# Patient Record
Sex: Male | Born: 1985
Health system: Southern US, Community
[De-identification: ages and names within clinical notes are randomized; demographics above are authoritative.]

## PROBLEM LIST (undated history)

## (undated) DIAGNOSIS — F419 Anxiety disorder, unspecified: Secondary | ICD-10-CM

## (undated) DIAGNOSIS — T7840XA Allergy, unspecified, initial encounter: Secondary | ICD-10-CM

## (undated) DIAGNOSIS — F32A Depression, unspecified: Secondary | ICD-10-CM

## (undated) HISTORY — PX: HERNIA REPAIR: SHX51

## (undated) HISTORY — DX: Allergy, unspecified, initial encounter: T78.40XA

## (undated) HISTORY — PX: UMBILICAL HERNIA REPAIR: SHX196

## (undated) HISTORY — DX: Anxiety disorder, unspecified: F41.9

## (undated) HISTORY — PX: RESECTION DISTAL CLAVICAL: SHX5053

## (undated) HISTORY — DX: Depression, unspecified: F32.A

## (undated) HISTORY — PX: FEMUR FRACTURE SURGERY: SHX633

## (undated) HISTORY — PX: KNEE SURGERY: SHX244

---
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
## 2007-06-17 ENCOUNTER — Emergency Department (HOSPITAL_COMMUNITY): Admission: EM | Admit: 2007-06-17 | Discharge: 2007-06-18 | Payer: Self-pay | Admitting: Emergency Medicine

## 2007-06-21 ENCOUNTER — Emergency Department (HOSPITAL_COMMUNITY): Admission: EM | Admit: 2007-06-21 | Discharge: 2007-06-22 | Payer: Self-pay | Admitting: Emergency Medicine

## 2009-06-10 ENCOUNTER — Inpatient Hospital Stay (HOSPITAL_COMMUNITY): Admission: EM | Admit: 2009-06-10 | Discharge: 2009-06-13 | Payer: Self-pay | Admitting: Emergency Medicine

## 2010-07-26 LAB — DIFFERENTIAL
Basophils Absolute: 0 10*3/uL (ref 0.0–0.1)
Basophils Absolute: 0 10*3/uL (ref 0.0–0.1)
Basophils Relative: 0 % (ref 0–1)
Basophils Relative: 0 % (ref 0–1)
Eosinophils Absolute: 0 10*3/uL (ref 0.0–0.7)
Eosinophils Absolute: 0.1 10*3/uL (ref 0.0–0.7)
Eosinophils Relative: 0 % (ref 0–5)
Eosinophils Relative: 0 % (ref 0–5)
Lymphocytes Relative: 14 % (ref 12–46)
Lymphocytes Relative: 7 % — ABNORMAL LOW (ref 12–46)
Lymphs Abs: 0.8 10*3/uL (ref 0.7–4.0)
Lymphs Abs: 2.5 10*3/uL (ref 0.7–4.0)
Monocytes Absolute: 1 10*3/uL (ref 0.1–1.0)
Monocytes Absolute: 1.2 10*3/uL — ABNORMAL HIGH (ref 0.1–1.0)
Monocytes Relative: 7 % (ref 3–12)
Monocytes Relative: 9 % (ref 3–12)
Neutro Abs: 14.3 10*3/uL — ABNORMAL HIGH (ref 1.7–7.7)
Neutro Abs: 9.3 10*3/uL — ABNORMAL HIGH (ref 1.7–7.7)
Neutrophils Relative %: 79 % — ABNORMAL HIGH (ref 43–77)
Neutrophils Relative %: 83 % — ABNORMAL HIGH (ref 43–77)

## 2010-07-26 LAB — CBC
HCT: 27.9 % — ABNORMAL LOW (ref 39.0–52.0)
HCT: 30 % — ABNORMAL LOW (ref 39.0–52.0)
HCT: 32.1 % — ABNORMAL LOW (ref 39.0–52.0)
HCT: 37.8 % — ABNORMAL LOW (ref 39.0–52.0)
Hemoglobin: 10.3 g/dL — ABNORMAL LOW (ref 13.0–17.0)
Hemoglobin: 11 g/dL — ABNORMAL LOW (ref 13.0–17.0)
Hemoglobin: 13.1 g/dL (ref 13.0–17.0)
Hemoglobin: 9.6 g/dL — ABNORMAL LOW (ref 13.0–17.0)
MCHC: 34.2 g/dL (ref 30.0–36.0)
MCHC: 34.3 g/dL (ref 30.0–36.0)
MCHC: 34.3 g/dL (ref 30.0–36.0)
MCHC: 34.8 g/dL (ref 30.0–36.0)
MCV: 92.6 fL (ref 78.0–100.0)
MCV: 93.1 fL (ref 78.0–100.0)
MCV: 93.7 fL (ref 78.0–100.0)
MCV: 94.9 fL (ref 78.0–100.0)
Platelets: 151 10*3/uL (ref 150–400)
Platelets: 178 10*3/uL (ref 150–400)
Platelets: 193 10*3/uL (ref 150–400)
Platelets: 305 10*3/uL (ref 150–400)
RBC: 2.94 MIL/uL — ABNORMAL LOW (ref 4.22–5.81)
RBC: 3.22 MIL/uL — ABNORMAL LOW (ref 4.22–5.81)
RBC: 3.46 MIL/uL — ABNORMAL LOW (ref 4.22–5.81)
RBC: 4.03 MIL/uL — ABNORMAL LOW (ref 4.22–5.81)
RDW: 13 % (ref 11.5–15.5)
RDW: 13.1 % (ref 11.5–15.5)
RDW: 13.1 % (ref 11.5–15.5)
RDW: 13.3 % (ref 11.5–15.5)
WBC: 11.2 10*3/uL — ABNORMAL HIGH (ref 4.0–10.5)
WBC: 18.1 10*3/uL — ABNORMAL HIGH (ref 4.0–10.5)
WBC: 9.3 10*3/uL (ref 4.0–10.5)
WBC: 9.8 10*3/uL (ref 4.0–10.5)

## 2010-07-26 LAB — PROTIME-INR
INR: 1.15 (ref 0.00–1.49)
INR: 1.39 (ref 0.00–1.49)
INR: 1.49 (ref 0.00–1.49)
Prothrombin Time: 14.6 seconds (ref 11.6–15.2)
Prothrombin Time: 16.9 seconds — ABNORMAL HIGH (ref 11.6–15.2)
Prothrombin Time: 17.9 seconds — ABNORMAL HIGH (ref 11.6–15.2)

## 2010-07-26 LAB — COMPREHENSIVE METABOLIC PANEL
ALT: 48 U/L (ref 0–53)
ALT: 74 U/L — ABNORMAL HIGH (ref 0–53)
AST: 130 U/L — ABNORMAL HIGH (ref 0–37)
AST: 66 U/L — ABNORMAL HIGH (ref 0–37)
Albumin: 2.9 g/dL — ABNORMAL LOW (ref 3.5–5.2)
Albumin: 3.8 g/dL (ref 3.5–5.2)
Alkaline Phosphatase: 42 U/L (ref 39–117)
Alkaline Phosphatase: 49 U/L (ref 39–117)
BUN: 14 mg/dL (ref 6–23)
BUN: 6 mg/dL (ref 6–23)
CO2: 24 mEq/L (ref 19–32)
CO2: 29 mEq/L (ref 19–32)
Calcium: 8 mg/dL — ABNORMAL LOW (ref 8.4–10.5)
Calcium: 8.5 mg/dL (ref 8.4–10.5)
Chloride: 104 mEq/L (ref 96–112)
Chloride: 106 mEq/L (ref 96–112)
Creatinine, Ser: 0.95 mg/dL (ref 0.4–1.5)
Creatinine, Ser: 0.98 mg/dL (ref 0.4–1.5)
GFR calc Af Amer: >60 mL/min (ref >60)
GFR calc Af Amer: >60 mL/min (ref >60)
GFR calc non Af Amer: >60 mL/min (ref >60)
GFR calc non Af Amer: >60 mL/min (ref >60)
Glucose, Bld: 120 mg/dL — ABNORMAL HIGH (ref 70–99)
Glucose, Bld: 88 mg/dL (ref 70–99)
Potassium: 4 mEq/L (ref 3.5–5.1)
Potassium: 4 mEq/L (ref 3.5–5.1)
Sodium: 137 mEq/L (ref 135–145)
Sodium: 139 mEq/L (ref 135–145)
Total Bilirubin: 0.5 mg/dL (ref 0.3–1.2)
Total Bilirubin: 0.6 mg/dL (ref 0.3–1.2)
Total Protein: 5.1 g/dL — ABNORMAL LOW (ref 6.0–8.3)
Total Protein: 6.5 g/dL (ref 6.0–8.3)

## 2010-07-26 LAB — APTT
aPTT: 28 seconds (ref 24–37)
aPTT: 39 seconds — ABNORMAL HIGH (ref 24–37)

## 2010-07-26 LAB — ABO/RH: ABO/RH(D): B POS

## 2010-07-26 LAB — TYPE AND SCREEN
ABO/RH(D): B POS
Antibody Screen: NEGATIVE

## 2010-07-26 LAB — ETHANOL: Alcohol, Ethyl (B): 82 mg/dL — ABNORMAL HIGH (ref 0–10)

## 2010-09-21 NOTE — Op Note (Signed)
NAME:  Philip Todd, Philip Todd                          ACCOUNT NO.:  [id number]   MEDICAL RECORD NO.:  [id number]                   PATIENT TYPE:  AMB   LOCATION:  DSC                                  FACILITY:  [facility]   PHYSICIAN:  Katy Fitch. Naaman Plummer., M.D.          DATE OF BIRTH:  [birth year]   DATE OF PROCEDURE:  04/22/2003  DATE OF DISCHARGE:  04/22/2003                                 OPERATIVE REPORT   PREOPERATIVE DIAGNOSIS:  Closed displaced right small finger metacarpal neck  fracture, Salter II-III.   POSTOPERATIVE DIAGNOSIS:  Closed displaced right small finger metacarpal  neck fracture, Salter II-III.   OPERATION:  Closed reduction and percutaneous Kirschner wire fixation of  right small finger metacarpal neck fracture utilizing 0.045 inch Kirschner  wires x4.   SURGEON:  Katy Fitch. Sypher, M.D.   ASSISTANT:  None.   ANESTHESIA:  General by LMA.   ANESTHESIOLOGIST:  Burna Forts, M.D.   INDICATIONS FOR PROCEDURE:  The patient is a 25 year old referred by the  [place] for the evaluation and  management of a severely displaced right small finger metacarpal fracture.  He had struck his hand against a firm object, sustaining a 100% displaced  metacarpal neck fracture.  He had more than 70 degrees of angulation of his  metacarpal head, which had fallen in a palmar direction off of the shaft of  the metacarpal.  He was referred for clinical evaluation and management at  this time.   DESCRIPTION OF PROCEDURE:  The patient was brought to the operating room and  placed in the supine position upon the operating room table.  Following an  informed consent in the office, as well as at the [facility] with his mother in attendance, we recommended  proceeding with a closed reduction and percutaneous Kirschner wire fixation  of his fracture.  He was transferred to the operating room and placed in the  supine position  on the operating room table.  Following the induction of general anesthesia  by LMA, the right arm was prepped with Betadine soap and solution and  sterilely draped.  No tourniquet was applied.  When anesthesia was  satisfactory, the fracture was manipulated with traction, hyperextension of  the metacarpal head, and three-point molding of the neck.  A virtual  anatomic reduction was achieved.  Two 0.045 inch Kirschner wires were drilled through the metacarpal head into  the metaphysis of the adjacent ring finger metacarpal to control rotation  and to prevent subsidence.  An additional two 0.045 inch Kirschner wires  were placed through the proximal metaphysis deep to the head, particularly  on its radial aspect, to prevent loss of the reduction with respect to apex  dorsal angulation of the fracture due to comminution of the palmar cortex.  AP and lateral images documented a virtual anatomic reduction of the  fracture.  The clinical alignment of the small finger was restored with near  anatomic length and no radial or ulnar rotation of the distal fragment.  The pins were trimmed and dressed in the usual manner with Xeroflo directly  on the skin, protecting the pin sites.  A dorsal palmar plaster sandwich  splint was applied, maintaining the hand in the same position.  There were  no apparent complications.  Both preoperatively and postoperatively the Barocio family was informed that  this fracture will likely cause a loss of growth in the small finger  metacarpal distal physis; however, as he is 17, he is nearly at the  completion of the skeletal growth.  There should be no significant  deformity.                                               Katy Fitch Naaman Plummer., M.D.    RVS/MEDQ  D:  04/22/2003  T:  04/24/2003  Job:  [id number]   cc:   Anesthesia Department - [facility]

## 2016-10-23 ENCOUNTER — Encounter: Payer: Self-pay | Admitting: Family Medicine

## 2016-10-23 ENCOUNTER — Ambulatory Visit (INDEPENDENT_AMBULATORY_CARE_PROVIDER_SITE_OTHER): Payer: 59 | Admitting: Family Medicine

## 2016-10-23 VITALS — BP 126/82 | HR 98 | Ht 71.25 in | Wt 167.5 lb

## 2016-10-23 DIAGNOSIS — Z9151 Personal history of suicidal behavior: Secondary | ICD-10-CM

## 2016-10-23 DIAGNOSIS — Z8659 Personal history of other mental and behavioral disorders: Secondary | ICD-10-CM

## 2016-10-23 DIAGNOSIS — Z915 Personal history of self-harm: Secondary | ICD-10-CM

## 2016-10-23 DIAGNOSIS — Z719 Counseling, unspecified: Secondary | ICD-10-CM

## 2016-10-23 DIAGNOSIS — Z62819 Personal history of unspecified abuse in childhood: Secondary | ICD-10-CM

## 2016-10-23 DIAGNOSIS — T1490XA Injury, unspecified, initial encounter: Secondary | ICD-10-CM

## 2016-10-23 DIAGNOSIS — F17200 Nicotine dependence, unspecified, uncomplicated: Secondary | ICD-10-CM | POA: Insufficient documentation

## 2016-10-23 DIAGNOSIS — F1290 Cannabis use, unspecified, uncomplicated: Secondary | ICD-10-CM

## 2016-10-23 DIAGNOSIS — Z1389 Encounter for screening for other disorder: Secondary | ICD-10-CM

## 2016-10-23 DIAGNOSIS — Z0000 Encounter for general adult medical examination without abnormal findings: Secondary | ICD-10-CM

## 2016-10-23 NOTE — Progress Notes (Signed)
New patient office visit note:  Impression and Recommendations:    1. Encounter for wellness examination   2. Screening for multiple conditions   3. Health education/counseling   4. Nicotine dependence, uncomplicated, unspecified nicotine product type      No problem-specific Assessment & Plan notes found for this encounter.   The patient was counseled, risk factors were discussed, anticipatory guidance given.   New Prescriptions   No medications on file     Discontinued Medications   No medications on file      Orders Placed This Encounter  Procedures  . CBC with Differential/Platelet  . Comprehensive metabolic panel  . Hemoglobin A1c  . Hepatitis C antibody  . HIV antibody  . Lipid panel  . T4, free  . TSH  . VITAMIN D 25 Hydroxy (Vit-D Deficiency, Fractures)     Gross side effects, risk and benefits, and alternatives of medications discussed with patient.  Patient is aware that all medications have potential side effects and we are unable to predict every side effect or drug-drug interaction that may occur.  Expresses verbal understanding and consents to current therapy plan and treatment regimen.  Return for FBW near future and then f/up 38mo or sooner as needed.  Please see AVS handed out to patient at the end of our visit for further patient instructions/ counseling done pertaining to today's office visit.    Note: This document was prepared using Dragon voice recognition software and may include unintentional dictation errors.  ----------------------------------------------------------------------------------------------------------------------    Subjective:    Chief complaint:   Chief Complaint  Patient presents with  . Establish Care     HPI: Philip Todd is a pleasant 31 y.o. male who [facility] to review their medical history with me and establish care.   I asked the patient to  review their chronic problem list with me to ensure everything was updated and accurate.    All recent office visits with other providers, any medical records that patient brought in etc  - I reviewed today.     Also asked pt to get me medical records from [facility] providers/ specialists that they had seen within the past 3-5 years- if they are in private practice and/or do not work for a [facility], [facility], [facility], [facility] or [facility] owned practice.  Told them to call their specialists to clarify this if they are not sure.   Remember please see sticky note for full health history.   Problem  Screening for Multiple Conditions  Health Education/Counseling  Nicotine Dependence      Wt Readings from Last 3 Encounters:  10/23/16 167 lb 8 oz (76 kg)   BP Readings from Last 3 Encounters:  10/23/16 130/90   Pulse Readings from Last 3 Encounters:  10/23/16 (!) 112   BMI Readings from Last 3 Encounters:  10/23/16 23.20 kg/m    Patient Care Team    Relationship Specialty Notifications Start End  Thomasene Lotpalski, Solmon Bohr, DO PCP - General Family Medicine  10/23/16     Patient Active Problem List   Diagnosis Date Noted  . Screening for multiple conditions 10/23/2016  . Health education/counseling 10/23/2016  . Nicotine dependence 10/23/2016     History reviewed. No pertinent past medical history.   History reviewed. No pertinent past medical history.   History reviewed. No pertinent surgical history.   Family History  Problem Relation Age of Onset  . Diabetes  Mother      History  Drug Use No     History  Alcohol Use  . Yes    Comment: occ.      History  Smoking Status  . Never Smoker  Smokeless Tobacco  . Never Used     No outpatient encounter prescriptions on file as of 10/23/2016.   No facility-administered encounter medications on file as of 10/23/2016.     Allergies: Penicillins   ROS   Objective:   Blood pressure 130/90, pulse (!) 112, height 5'  11.25" (1.81 m), weight 167 lb 8 oz (76 kg). Body mass index is 23.2 kg/m. General: Well Developed, well nourished, and in no acute distress.  Neuro: Alert and oriented x3, extra-ocular muscles intact, sensation grossly intact.  HEENT:Cataract/AT, PERRLA, neck supple, No carotid bruits Skin: no gross rashes  Cardiac: Regular rate and rhythm Respiratory: Essentially clear to auscultation bilaterally. Not using accessory muscles, speaking in full sentences.  Abdominal: not grossly distended Musculoskeletal: Ambulates w/o diff, FROM * 4 ext.  Vasc: less 2 sec cap RF, warm and pink  Psych:  No HI/SI, judgement and insight good, Euthymic mood. Full Affect.    No results found for this or any previous visit (from the past 2160 hour(s)).

## 2016-10-23 NOTE — Patient Instructions (Addendum)
Please call your insurance company and find out if they have preferred counselors in the area.    Please return to clinic for full list of them.   If you have insomnia or difficulty sleeping, this information is for you:  - Avoid caffeinated beverages after lunch,  no alcoholic beverages,  no eating within 2-3 hours of lying down,  avoid exposure to blue light before bed,  avoid daytime naps, and  needs to maintain a regular sleep schedule- go to sleep and wake up around the same time every night.   - Resolve concerns or worries before entering bedroom:  Discussed relaxation techniques with patient and to keep a journal to write down fears\ worries.  I suggested seeing a counselor for CBT.   - Recommend patient meditate or do deep breathing exercises to help relax.   Incorporate the use of white noise machines or listen to "sleep meditation music", or recordings of guided meditations for sleep from YouTube which are free, such as  "guided meditation for detachment from over thinking"  by Ina Kick.      What is Chronic Stress Syndrome, Symptoms & Ways to Deal With it   What is Chronic Stress Syndrome?  Chronic Stress Syndrome is something which can now be called as a medical condition due to the amount of stress an individual is going through these days. Chronic Stress Syndrome causes the body and mind to shutdown and the person has no control over himself or herself. Due to the demands of modern day life and the hardship throughout day and night takes its toll over a period of time and the body and brain starts demanding rest and a break. This leads to certain symptoms where your performance level starts to dip at work, you become irritable both at work and at home, you may stop enjoying activities you previously liked, you may become depressed, you may get angry for even small things. Chronic Stress Syndrome can significantly impact your quality life. Thus it is important understand the  symptoms of Chronic Stress Syndrome and react accordingly in order to cope up with it.  It is important to note here that a balanced work-home equation should be drawn to cut down symptoms of Chronic Stress Syndrome. Minor stressors can be overcome by the body's inbuilt stress response but when there is unending stress for a long period of time then an external help is required to ease the stress.  Chronic Stress Syndrome can physically and psychologically drain you over a period of time. For such cases stress management is the best way to cope up with Chronic Stress Syndrome. If Chronic Stress Syndrome is not treated then it may result in many health hazards like anxiety, muscle pain, insomnia, and high blood pressure along with a compromised immune system leading to frequent infections and missed days from work.    What are the Symptoms of Chronic Stress Syndrome?   The symptoms of Chronic Stress Syndrome are variable and range from generalized symptoms to emotional symptoms along with behavioral and cognitive symptoms. Some of these symptoms have been delineated below:  Generalized Symptoms of Chronic Stress Syndrome are: Anxiety Depression Social isolation Headache Abdominal pain Lack of sleep Back pain Difficulty in concentrating Hypertension Hemorrhoids Varicose veins Panic attacks/ Panic disorder Cardiovascular diseases.   Some of the Emotional Symptoms of Chronic Stress Syndrome are: To become easily agitated, moody and frustrated Feeling overwhelmed which makes you feel like you are losing control. Having difficulty relaxing and  have a peaceful mind Having low self esteem Feeling lonely Feeling worthless Feeling depressed Avoiding social environment.   Some of the Physical Symptoms of Chronic Stress Syndrome are: Headaches Lethargy Alternating diarrhea and constipation Nausea Muscles aches and pains Insomnia Rapid heartbeat and chest pain Infections and  frequent colds Decreased libido Nervousness and shaking Tinnitus Sweaty palms Dry mouth Clenched jaw.  Some of the Cognitive Symptoms of Chronic Stress Syndrome are: Constant worrying Racing thoughts Disorganization and forgetfulness Inability to focus Poor judgment Abundance of negativity.  Some of the Behavioral Symptoms of Chronic Stress Syndrome are: Changes in appetite with less desire to eat Avoiding responsibilities Indulgence in alcohol or recreational drug use Increased nail biting and being fidgety Ways to Deal With Chronic Stress Syndrome    Chronic Stress Syndrome is not something which cannot be addressed. A bit of effort from your side in the form of lifestyle modifications, a little bit of exercise, a balanced work life equation can do wonders and help you get rid of Chronic Stress Syndrome.  Get Proper Sleep: It has been proved that Chronic Stress Syndrome causes loss of sleep where an individual may not even be able to sleep for days unending. This may result in the individual feeling lethargic and unable to focus at work the following morning. This may lead to decreased performance at work. Thus, it is important to have a good sleep-wake cycle. For this, try and not drink any caffeinated beverage about four hours prior to going to sleep, as caffeine pumps up the adrenaline and causes you to stay awake resulting ultimately in Chronic Stress Syndrome.  Avoid Alcohol and Drugs: Another way to get rid of Chronic Stress Syndrome is lifestyle modifications. Stay away from alcohol and other recreational drugs. Take Short Frequent Breaks at Work: Try to take frequent breaks from work and do not work continuously. Try and manage your work in such a way that you even meet your deadline and come home on time for a happy dinner with family. A good time spent with family and kids does wonders in not only dealing with Chronic Stress Syndrome but also preventing it.  Become  Physically Active: Another step towards getting rid of Chronic Stress Syndrome is physical activity. If you do not have time to spend at the gym then at least try and go for daily walks for about half an hour a day which not only keeps the stress away but also is good for your overall health. Physical activity leads to production of endorphins which will make you feel relaxed and feel good.  Healthy Diet Can Help You Deal With Chronic Stress Syndrome: Have a balanced and healthy diet is another step towards a stress free life and keeping Chronic Stress Syndrome at bay. If time is a constraint then you can try eating three small meals a day. Try and avoid fast foods and take foods which are healthy and rich in proteins, fiber, and carbohydrates to boost your energy system.  Music Can Soothe Your Mind: Light music is one of the best and most effective relaxation techniques that one can try to overcome stress. It has shown to calm down the mind and take you away from all the stressors that you may be having. These days it is also being used as a therapy in some institutes for overcoming stress. It is important here to discuss the importance of a good social support system for patients with Chronic Stress Syndrome, as a good social support  framework can do wonders in taking the stress away from the patient and overcoming Chronic Stress Syndrome.  Meditation Can Help You Deal With Chronic Stress Syndrome Effectively: Meditation and yoga has also shown to be quite effective in relaxing the mind and coping up with Chronic Stress Syndrome   In cases where these measures are not helpful, then it is time for you to consult with a skilled psychologist or a psychiatrist for potential therapies or medications to control the stress response.   The psychologist can help you with a variety of steps for coping up with Chronic Stress Syndrome. Relaxation techniques and behavioral therapy are some of the methods employed by  psychologists. In some cases, medications can also be given to help relax the patient.  Since Chronic Stress Syndrome is both emotionally and physically draining for the patient and it also adversely affects the family life of the patient hence it is important for the patient to recognize the condition and taking steps to cope up with it. Escaping measures like alcohol and drug use are of no help as they only aggravate the condition apart from their other health hazards. If this condition is ignored or left untreated it can lead to various medical conditions like anxiety and depression and various other medical conditions.  Last but not least, smile as often as you can as it is the best gift that you can give to someone. The best way to stay relaxed is to have a good smile, exercise daily, spend time with your family, meditation and if required consultation with a good psychologist so that you can live a stress free life and overcome the symptoms of Chronic Stress Syndrome.

## 2016-10-24 ENCOUNTER — Encounter: Payer: Self-pay | Admitting: Family Medicine

## 2016-11-15 ENCOUNTER — Other Ambulatory Visit (INDEPENDENT_AMBULATORY_CARE_PROVIDER_SITE_OTHER): Payer: 59

## 2016-11-15 DIAGNOSIS — Z1389 Encounter for screening for other disorder: Secondary | ICD-10-CM | POA: Diagnosis not present

## 2016-11-15 DIAGNOSIS — Z0000 Encounter for general adult medical examination without abnormal findings: Secondary | ICD-10-CM

## 2016-11-15 DIAGNOSIS — Z719 Counseling, unspecified: Secondary | ICD-10-CM

## 2016-11-16 LAB — HIV ANTIBODY (ROUTINE TESTING W REFLEX): HIV Screen 4th Generation wRfx: NONREACTIVE

## 2016-11-16 LAB — CBC WITH DIFFERENTIAL/PLATELET
Basophils Absolute: 0 10*3/uL (ref 0.0–0.2)
Basos: 0 %
EOS (ABSOLUTE): 0.1 10*3/uL (ref 0.0–0.4)
Eos: 1 %
Hematocrit: 45.7 % (ref 37.5–51.0)
Hemoglobin: 15.4 g/dL (ref 13.0–17.7)
Immature Grans (Abs): 0 10*3/uL (ref 0.0–0.1)
Immature Granulocytes: 0 %
Lymphocytes Absolute: 2.4 10*3/uL (ref 0.7–3.1)
Lymphs: 42 %
MCH: 30 pg (ref 26.6–33.0)
MCHC: 33.7 g/dL (ref 31.5–35.7)
MCV: 89 fL (ref 79–97)
Monocytes Absolute: 0.4 10*3/uL (ref 0.1–0.9)
Monocytes: 8 %
Neutrophils Absolute: 2.8 10*3/uL (ref 1.4–7.0)
Neutrophils: 49 %
Platelets: 284 10*3/uL (ref 150–379)
RBC: 5.13 x10E6/uL (ref 4.14–5.80)
RDW: 13.1 % (ref 12.3–15.4)
WBC: 5.7 10*3/uL (ref 3.4–10.8)

## 2016-11-16 LAB — COMPREHENSIVE METABOLIC PANEL
ALT: 15 IU/L (ref 0–44)
AST: 15 IU/L (ref 0–40)
Albumin/Globulin Ratio: 1.7 (ref 1.2–2.2)
Albumin: 4.7 g/dL (ref 3.5–5.5)
Alkaline Phosphatase: 55 IU/L (ref 39–117)
BUN/Creatinine Ratio: 23 — ABNORMAL HIGH (ref 9–20)
BUN: 23 mg/dL — ABNORMAL HIGH (ref 6–20)
Bilirubin Total: 0.6 mg/dL (ref 0.0–1.2)
CO2: 21 mmol/L (ref 20–29)
Calcium: 9.8 mg/dL (ref 8.7–10.2)
Chloride: 102 mmol/L (ref 96–106)
Creatinine, Ser: 1.02 mg/dL (ref 0.76–1.27)
GFR calc Af Amer: 113 mL/min/{1.73_m2} (ref >59)
GFR calc non Af Amer: 98 mL/min/{1.73_m2} (ref >59)
Globulin, Total: 2.8 g/dL (ref 1.5–4.5)
Glucose: 94 mg/dL (ref 65–99)
Potassium: 4.3 mmol/L (ref 3.5–5.2)
Sodium: 144 mmol/L (ref 134–144)
Total Protein: 7.5 g/dL (ref 6.0–8.5)

## 2016-11-16 LAB — T4, FREE: Free T4: 1.45 ng/dL (ref 0.82–1.77)

## 2016-11-16 LAB — HEMOGLOBIN A1C
Est. average glucose Bld gHb Est-mCnc: 105 mg/dL
Hgb A1c MFr Bld: 5.3 % (ref 4.8–5.6)

## 2016-11-16 LAB — LIPID PANEL
Chol/HDL Ratio: 2.9 ratio (ref 0.0–5.0)
Cholesterol, Total: 158 mg/dL (ref 100–199)
HDL: 54 mg/dL (ref >39)
LDL Calculated: 89 mg/dL (ref 0–99)
Triglycerides: 77 mg/dL (ref 0–149)
VLDL Cholesterol Cal: 15 mg/dL (ref 5–40)

## 2016-11-16 LAB — VITAMIN D 25 HYDROXY (VIT D DEFICIENCY, FRACTURES): Vit D, 25-Hydroxy: 32.1 ng/mL (ref 30.0–100.0)

## 2016-11-16 LAB — HEPATITIS C ANTIBODY: Hep C Virus Ab: <0.1 s/co ratio (ref 0.0–0.9)

## 2016-11-16 LAB — TSH: TSH: 1.93 u[IU]/mL (ref 0.450–4.500)

## 2018-01-01 ENCOUNTER — Other Ambulatory Visit (INDEPENDENT_AMBULATORY_CARE_PROVIDER_SITE_OTHER): Payer: BLUE CROSS/BLUE SHIELD

## 2018-01-01 ENCOUNTER — Other Ambulatory Visit: Payer: Self-pay | Admitting: Family Medicine

## 2018-01-01 ENCOUNTER — Ambulatory Visit (INDEPENDENT_AMBULATORY_CARE_PROVIDER_SITE_OTHER): Payer: BLUE CROSS/BLUE SHIELD | Admitting: Family Medicine

## 2018-01-01 ENCOUNTER — Encounter: Payer: Self-pay | Admitting: Family Medicine

## 2018-01-01 VITALS — BP 135/91 | HR 106 | Ht 71.0 in | Wt 155.8 lb

## 2018-01-01 DIAGNOSIS — Z719 Counseling, unspecified: Secondary | ICD-10-CM | POA: Diagnosis not present

## 2018-01-01 DIAGNOSIS — F17200 Nicotine dependence, unspecified, uncomplicated: Secondary | ICD-10-CM | POA: Diagnosis not present

## 2018-01-01 DIAGNOSIS — Z0001 Encounter for general adult medical examination with abnormal findings: Secondary | ICD-10-CM | POA: Diagnosis not present

## 2018-01-01 DIAGNOSIS — K4090 Unilateral inguinal hernia, without obstruction or gangrene, not specified as recurrent: Secondary | ICD-10-CM | POA: Insufficient documentation

## 2018-01-01 DIAGNOSIS — N6320 Unspecified lump in the left breast, unspecified quadrant: Secondary | ICD-10-CM

## 2018-01-01 DIAGNOSIS — Z1389 Encounter for screening for other disorder: Secondary | ICD-10-CM

## 2018-01-01 DIAGNOSIS — Z0000 Encounter for general adult medical examination without abnormal findings: Secondary | ICD-10-CM

## 2018-01-01 NOTE — Progress Notes (Signed)
Male physical  Impression and Recommendations:    1. Encounter for general adult medical examination with abnormal findings   2. Health education/counseling   3. Indirect left inguinal hernia   4. Nicotine dependence, uncomplicated, unspecified nicotine product type   5. Breast mass, left     Inguinal Hernia - Avoid heavy lifting.  Red flag symptoms discussed with patient. - Advised patient no intervention needed right now, but if develops pain, nausea, or other concerning sx, to return to clinic for re-check.  Breast Mass, Left - Referral placed for ultrasound evaluation of left pectoral/breast area, and surgical consult after if applicable.   1) Anticipatory Guidance: Discussed importance of wearing a seatbelt while driving, not texting while driving;   sunscreen when outside along with skin surveillance; eating a balanced and modest diet; physical activity at least 25 minutes per day or 150 min/ week moderate to intense activity.   2) Immunizations / Screenings / Labs:  All immunizations are up-to-date per recommendations or will be updated today. Patient is due for dental and vision screens which pt will schedule independently. Will obtain CBC, CMP, HgA1c, Lipid panel, TSH and vit D when fasting, if not already done recently.    3) Weight:   BMI meaning discussed with patient.  Discussed goal of losing 5-10% of current body weight which would improve overall feelings of well being and improve objective health data. Improve nutrient density of diet through increasing intake of fruits and vegetables and decreasing saturated fats, white flour products and refined sugars.    4) Smoking Cessation - Encouraged patient to begin by weaning down on e-cigarettes from 5, to 3, to 0.  - Told pt to think seriously about quitting smoking!  Told pt it is very important for his/her health and well being.    - Smoking cessation instruction/counseling given:  counseled patient on the  dangers of tobacco use, advised patient to stop smoking, and reviewed strategies to maximize success  - Discussed with patient that there are multiple treatments to aid in quitting smoking, however I explained none will work unless pt really want to quit  - Told to call 1-800-QUIT-NOW 938-820-0072(1-585-280-8219-669) for free smoking cessation counseling and support, or pt can go online to www.heart.org - the American Heart Association website and search "quit smoking ".    5) BMI Counseling Explained to patient what BMI refers to, and what it means medically.    Told patient to think about it as a "medical risk stratification measurement" and how increasing BMI is associated with increasing risk/ or worsening state of various diseases such as hypertension, hyperlipidemia, diabetes, premature OA, depression etc.  American Heart Association guidelines for healthy diet, basically Mediterranean diet, and exercise guidelines of 30 minutes 5 days per week or more discussed in detail.  Health counseling performed.  All questions answered.    6) Lifestyle & Preventative Health Maintenance - Advised patient to continue working toward exercising to improve overall mental, physical, and emotional health.    - Encouraged patient to engage in daily physical activity, especially a formal exercise routine.  Recommended that the patient eventually strive for at least 150 minutes of moderate cardiovascular activity per week according to guidelines established by the [facility].   - Healthy dietary habits encouraged, including low-carb, and high amounts of lean protein in diet.   - Patient should also consume adequate amounts of water - half of body weight in oz of water per day.   7) Follow-Up -  Return in 6 months for chronic follow-up, or sooner if needed.  - Patient knows that he can return PRN for acute concerns.   Orders Placed This Encounter  Procedures  . US BREAST LTD UNI LEFT INC AXILLA    [employer]//CO SIGN REQ  01/01/18 PF NONE//LEFT BREAST MASS//NO BREAST SX'S/NO NEEDS/SB W/PT     Standing Status:   Future    Number of Occurrences:   1    Standing Expiration Date:   01/02/2019    Order Specific Question:   Reason for Exam (SYMPTOM  OR DIAGNOSIS REQUIRED)    Answer:   mass    Order Specific Question:   Preferred imaging location?    Answer:   [facility]  . Ambulatory referral to General Surgery    Referral Priority:   Routine    Referral Type:   Surgical    Referral Reason:   Specialty Services Required    Requested Specialty:   General Surgery    Number of Visits Requested:   1    Gross side effects, risk and benefits, and alternatives of medications discussed with patient.  Patient is aware that all medications have potential side effects and we are unable to predict every side effect or drug-drug interaction that may occur.  Expresses verbal understanding and consents to current therapy plan and treatment regimen.  Please see AVS handed out to patient at the end of our visit for further patient instructions/ counseling done pertaining to today's office visit.  Follow-up preventative CPE in 1 year. Follow-up office visit pending lab work.  F/up sooner for chronic care management and/or prn  This document serves as a record of services personally performed by Thomasene Lot, DO. It was created on her behalf by Peggye Fothergill, a trained medical scribe. The creation of this record is based on the scribe's personal observations and the provider's statements to them.   I have reviewed the above medical documentation for accuracy and completeness and I concur.  Thomasene Lot 01/02/18 2:58 PM     Subjective:    CC: CPE  HPI: Philip Todd is a 32 y.o. male who presents to [facility] today for a yearly health maintenance exam.    Health Maintenance Summary Reviewed and updated, unless pt declines services.  Colonoscopy:    Unnecessary secondary to  < 18 years old. Patient is 32. Tobacco History Reviewed:  Current social smoker; using Juul e-smoking device. Patient desires to quit smoking. Abdominal Ultrasound:  Unnecessary secondary to < 74 years old. Alcohol:    Socially.  No concerns, no excessive use. Exercise Habits:   Patient exercises every day; states "puts in some miles," about 3-4 miles per day. STD concerns:   None; monogamous with wife Drug Use:  Continues smoking recreational marijuana. Birth control method:   No concerns Testicular/penile concerns:   None reported.  Denies family history of colon cancer.  Grandfather had colon or prostate cancer in late 50's, maybe 23's.  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  Recently got a new job and is working with his wife now.  Last eye exam was more than a year ago, less than two years ago.  Notes lately he's been having some issues hearing, and some issues seeing in the dark.  Patient reports two hernias, one in his bellybutton, and one in his pubic area.  States he is currently recovering from poison ivy.  Patient reports that he  has had a mass in his left pectoral/breast area for over a year.   Immunization History  Administered Date(s) Administered  . Td 06/19/2017    Health Maintenance  Topic Date Due  . INFLUENZA VACCINE  12/04/2017  . TETANUS/TDAP  06/20/2027  . HIV Screening  Completed      Wt Readings from Last 3 Encounters:  01/01/18 155 lb 12.8 oz (70.7 kg)  10/23/16 167 lb 8 oz (76 kg)   BP Readings from Last 3 Encounters:  01/01/18 (!) 135/91  10/23/16 126/82   Pulse Readings from Last 3 Encounters:  01/01/18 (!) 106  10/23/16 98    Patient Active Problem List   Diagnosis Date Noted  . Nicotine dependence 10/23/2016    Priority: High  . Trauma in childhood 10/23/2016    Priority: High  . Marijuana use, continuous 10/23/2016    Priority: Medium  . Indirect left inguinal hernia 01/01/2018  .  Breast mass, left 01/01/2018  . Screening for multiple conditions 10/23/2016  . Health education/counseling 10/23/2016  . History of posttraumatic stress disorder (PTSD)- as teen 10/23/2016  . H/O: attempted suicide as teen 10/23/2016    History reviewed. No pertinent past medical history.  History reviewed. No pertinent surgical history.  Family History  Problem Relation Age of Onset  . Diabetes Mother     Social History   Substance and Sexual Activity  Drug Use No  ,  Social History   Substance and Sexual Activity  Alcohol Use Yes   Comment: occ.   ,  Social History   Tobacco Use  Smoking Status Former Smoker  . Packs/day: 1.00  . Types: Cigarettes  . Last attempt to quit: 09/2017  . Years since quitting: 0.4  Smokeless Tobacco Never Used  ,  Social History   Substance and Sexual Activity  Sexual Activity Yes  . Partners: Female    Patient's Medications   No medications on file    Penicillins  Review of Systems: General:   Denies fever, chills, unexplained weight loss.  Optho/Auditory:   Denies visual changes, blurred vision/LOV Respiratory:   Denies SOB, DOE more than baseline levels.  Cardiovascular:   Denies chest pain, palpitations, new onset peripheral edema  Gastrointestinal:   Denies nausea, vomiting, diarrhea.  Genitourinary: Denies dysuria, freq/ urgency, flank pain or discharge from genitals.  Endocrine:     Denies hot or cold intolerance, polyuria, polydipsia. Musculoskeletal:   Denies unexplained myalgias, joint swelling, unexplained arthralgias, gait problems.  Skin:  Denies rash, suspicious lesions Neurological:     Denies dizziness, unexplained weakness, numbness  Psychiatric/Behavioral:   Denies mood changes, suicidal or homicidal ideations, hallucinations    Objective:     Blood pressure (!) 135/91, pulse (!) 106, height 5\' 11"  (1.803 m), weight 155 lb 12.8 oz (70.7 kg), SpO2 98 %. Body mass index is 21.73 kg/m. General  Appearance:    Alert, cooperative, no distress, appears stated age  Head:    Normocephalic, without obvious abnormality, atraumatic  Eyes:    PERRL, conjunctiva/corneas clear, EOM's intact, fundi    benign, both eyes  Ears:    Normal TM's and external ear canals, both ears  Nose:   Nares normal, septum midline, mucosa normal, no drainage    or sinus tenderness  Throat:   Lips w/o lesion, mucosa moist, and tongue normal; teeth and   gums normal  Neck:   Supple, symmetrical, trachea midline, no adenopathy;    thyroid:  no enlargement/tenderness/nodules; no carotid  bruit or JVD  Back:     Symmetric, no curvature, ROM normal, no CVA tenderness  Lungs:     Clear to auscultation bilaterally, respirations unlabored, no       Wh/ R/ R  Chest Wall:    At 3 o'clock position in the left breast about one inch lateral to the nipple is a 3 cm soft palpable mass; no other tenderness or gross deformity; normal excursion   Heart:    Regular rate and rhythm, S1 and S2 normal, no murmur, rub   or gallop  Abdomen:     Soft, non-tender, bowel sounds active all four quadrants, NO   G/R/R, no masses, no organomegaly  Genitalia:   Ext genitalia: without lesion, no penile rash or discharge, left inguinal hernia appreciated, most prominent internal ring, consistent with indirect hernia.  Rectal:    Deferred until age 71.  Extremities:   Extremities normal, atraumatic, no cyanosis or gross edema  Pulses:   2+ and symmetric all extremities  Skin:   Warm, dry, Skin color, texture, turgor normal, no obvious rashes or lesions  M-Sk:   Ambulates * 4 w/o difficulty, no gross deformities, tone WNL  Neurologic:   CNII-XII intact, normal strength, sensation and reflexes    Throughout Psych:  No HI/SI, judgement and insight good, Euthymic mood. Full Affect.

## 2018-01-01 NOTE — Patient Instructions (Addendum)
Melissa, please place order for unilateral L breast Ultrasound    Please think seriously about quitting smoking!  This is very important for your health and well being.   Smoking cessation instruction/counseling given:  counseled patient on the dangers of tobacco use, advised patient to stop smoking, and reviewed strategies to maximize success  Discussed with patient that there are multiple treatments to aid in quitting smoking, however I explained none will work unless pt really wants to quit  Please let us know in the future if you are interested and ready to quit.  You can also call 1-800-QUIT-NOW 6146994264) for free smoking cessation counseling and support.     Also, please go online to www.heart.org (the American Heart Association website) and search "quit smoking ".     Or try the centers for disease control website at: https://www.schmidt.com/  Or, go to the "national cancer institute" web site of NIH:  http://benson.com/  There is a lot of great information on these websites for you to look over.      Want to Quit Smoking? FDA-Approved Products Can Help  Quitting smoking can be hard, but it is possible. In fact, every time you put out a cigarette is a new chance to try quitting again, according to the U.S. Food and Drug Administration's newest tobacco education campaign, "Every Try Counts."   If you want to quit-almost 70 percent of adult smokers say they do-you may want to use a "smoking cessation" product proven to help. Data has shown that using FDA-approved cessation medicine can double your chance of quitting successfully.  Some products contain nicotine as an active ingredient and others do not. These products include over-the-counter (OTC) options like skin patches, lozenges, and gum, as well as prescription medicines.  Smoking cessation products are intended to help you quit  smoking. They are regulated through the [facility], which ensures that the products are safe and effective and that their benefits outweigh any known associated risks.  The Benefits of Quitting Smoking No matter how much you smoke-or for how long-quitting will benefit you.  Not only will you lower your risk of getting various cancers, including lung cancer, you'll also reduce your chances of having heart disease, a stroke, emphysema, and other serious diseases. Quitting also will lower the risk of heart disease and lung cancer in nonsmokers who otherwise would be exposed to your secondhand smoke.  Although there are benefits to quitting at any age, it is important to quit as soon as possible so your body can begin to heal from the damage caused by smoking. For instance, 12 hours after you quit smoking the carbon monoxide level in your blood drops to normal. Carbon monoxide is harmful because it displaces oxygen in the blood and deprives your heart, brain, and other vital organs of oxygen.  What To Know About Smoking Cessation Products Understanding how smoking cessation products work-and what side effects they may cause-can help you determine which product may be best for you.  If you're considering one of these products, reading labels and talking to your pharmacist and other health care providers are good first steps to take.  You also can check the FDA's website for more information on each product at Drugs_0 , where you can search for each product by name.  And remember to weigh each product's benefits and risks, among other considerations.  About Nicotine Replacement Therapy (NRT) Nicotine is the substance primarily responsible for causing addiction to tobacco products. Tobacco  users who are addicted to nicotine are used to having nicotine in their bodies.  As you try to quit smoking, you may have symptoms of nicotine withdrawal. When you quit, this  withdrawal may cause symptoms like cravings, or urges, to smoke; depression; trouble sleeping; irritability; anxiety; and increased appetite.  Nicotine withdrawal can discourage some smokers from continuing with a quit attempt. But the FDA has approved several smoking cessation products designed to help users gradually withdraw from smoking (that is, "wean" themselves from smoking) by using specific amounts of nicotine that decrease over time. This type of product is called a "nicotine replacement therapy" or NRT. It supplies nicotine in controlled amounts while sparing you from other chemicals found in tobacco products.  NRTs are available over the counter and by prescription. You should generally use them only for a short time to help you manage nicotine cravings and withdrawal. However, the FDA recognizes that some people may need to use these products longer to stay smoke-free. Talk to your health care provider to determine the best course of treatment for you.  Over-the-counter NRTs are approved for sale to people age 25 and older. They are available under various brand names and sometimes as generic products. They include:  - Skin patches (also called "transdermal nicotine patches"). These patches are placed on the skin, similar to how you would apply an adhesive bandage. - Chewing gum (also called "nicotine gum"). This gum must be chewed according to the labeled instructions to be effective. - Lozenges (also called "nicotine lozenges"). You use these products by dissolving them in your mouth. For over-the-counter products, it's important to follow the instructions on the Drug Facts Label (DFL) and to read the enclosed User's Guide for complete directions and other important information. Ask your health care provider if you have questions.  Currently, prescription nicotine replacement therapy is available only under the brand name Nicotrol, and is available both as a nasal spray and an oral  inhaler. The products are FDA-approved only for use by adults.  If you are under age 63 and want to quit smoking, talk to a health care professional about whether you should use nicotine replacement therapies.  Important Advice for People Considering Nicotine Replacement Therapy Women who are pregnant or breastfeeding should talk to their health care providers and use nicotine replacement products only if the health care providers approve.  Also talk to your health care provider before using these products if you have:  diabetes, heart disease, asthma, or stomach ulcers; had a recent heart attack; high blood pressure that is not controlled with medicine; a history of irregular heartbeat; or been prescribed medication to help you quit smoking. If you take prescription medication for depression or asthma, tell your health care provider if you are quitting smoking because he or she may need to change your prescription dose.  Stop using a nicotine replacement product and call your health care professional if you have any of the following symptoms: nausea; dizziness; weakness; vomiting; fast or irregular heartbeat; mouth problems with the lozenge or gum; or redness or swelling of the skin around the patch that does not go away.  About Prescription Cessation Medicines Without Nicotine  The FDA has approved two smoking cessation products that do not contain nicotine. They are Chantix (varenicline tartrate) and Zyban (buproprion hydrochloride). Both are available in tablet form and by prescription only.  Chantix acts at sites in the brain affected by nicotine by reducing the rewarding effects of nicotine. The precise way  that Zyban helps with smoking cessation is unknown.  As with other prescription products, the FDA has evaluated these medicines and found that the benefits outweigh the risks. For users taking these products, risks include changes in behavior, depressed mood, hostility,  aggression, and suicidal thoughts or actions.  The most common side effects of Chantix include nausea; constipation; gas; vomiting; and trouble sleeping or vivid, unusual, or strange dreams. Chantix also may change how you react to alcohol, so talk to your health care provider about your drinking habits (if you drink alcohol) and whether these habits need to change. Chantix is not recommended for people under the age of 74.  The most commonly observed side effects consistently associated with the use of Zyban are dry mouth and insomnia.  Because Zyban contains the same active ingredient as the antidepressant Wellbutrin (bupropion), the FDA encourages people who use Zyban-and those who are considering it-to talk to their health care providers about the risks of treatment with antidepressant medicines. Zyban has not been studied in children under the age of 68 and is not approved for use in children and teenagers.  Note: If your health care provider prescribes Chantix or Zyban, please read the product's patient medication guide in its entirety. These guides offer important information on side effects, risks, warnings, product ingredients, and what you should talk about with your health care provider before taking the products.  Finally, if you ever have any side effects related to any smoking cessation products, or have any other problems related to your treatment, the FDA would like to hear from you. Please consider making a voluntary and confidential report to the FDA's MedWatch program.  Updated: April 15, 2016    Preventive Care for Adults, Male A healthy lifestyle and preventive care can promote health and wellness. Preventive health guidelines for men include the following key practices:  A routine yearly physical is a good way to check with your health care provider about your health and preventative screening. It is a chance to share any concerns and updates on your health and to receive  a thorough exam.  Visit your dentist for a routine exam and preventative care every 6 months. Brush your teeth twice a day and floss once a day. Good oral hygiene prevents tooth decay and gum disease.  The frequency of eye exams is based on your age, health, family medical history, use of contact lenses, and other factors. Follow your health care provider's recommendations for frequency of eye exams.  Eat a healthy diet. Foods such as vegetables, fruits, whole grains, low-fat dairy products, and lean protein foods contain the nutrients you need without too many calories. Decrease your intake of foods high in solid fats, added sugars, and salt. Eat the right amount of calories for you.Get information about a proper diet from your health care provider, if necessary.  Regular physical exercise is one of the most important things you can do for your health. Most adults should get at least 150 minutes of moderate-intensity exercise (any activity that increases your heart rate and causes you to sweat) each week. In addition, most adults need muscle-strengthening exercises on 2 or more days a week.  Maintain a healthy weight. The body mass index (BMI) is a screening tool to identify possible weight problems. It provides an estimate of body fat based on height and weight. Your health care provider can find your BMI and can help you achieve or maintain a healthy weight.For adults 20 years and older:  A BMI below 18.5 is considered underweight.  A BMI of 18.5 to 24.9 is normal.  A BMI of 25 to 29.9 is considered overweight.  A BMI of 30 and above is considered obese.  Maintain normal blood lipids and cholesterol levels by exercising and minimizing your intake of saturated fat. Eat a balanced diet with plenty of fruit and vegetables. Blood tests for lipids and cholesterol should begin at age 64 and be repeated every 5 years. If your lipid or cholesterol levels are high, you are over 50, or you are at high  risk for heart disease, you may need your cholesterol levels checked more frequently.Ongoing high lipid and cholesterol levels should be treated with medicines if diet and exercise are not working.  If you smoke, find out from your health care provider how to quit. If you do not use tobacco, do not start.  Lung cancer screening is recommended for adults aged 21-80 years who are at high risk for developing lung cancer because of a history of smoking. A yearly low-dose CT scan of the lungs is recommended for people who have at least a 30-pack-year history of smoking and are a current smoker or have quit within the past 15 years. A pack year of smoking is smoking an average of 1 pack of cigarettes a day for 1 year (for example: 1 pack a day for 30 years or 2 packs a day for 15 years). Yearly screening should continue until the smoker has stopped smoking for at least 15 years. Yearly screening should be stopped for people who develop a health problem that would prevent them from having lung cancer treatment.  If you choose to drink alcohol, do not have more than 2 drinks per day. One drink is considered to be 12 ounces (355 mL) of beer, 5 ounces (148 mL) of wine, or 1.5 ounces (44 mL) of liquor.  Avoid use of street drugs. Do not share needles with anyone. Ask for help if you need support or instructions about stopping the use of drugs.  High blood pressure causes heart disease and increases the risk of stroke. Your blood pressure should be checked at least every 1-2 years. Ongoing high blood pressure should be treated with medicines, if weight loss and exercise are not effective.  If you are 65-61 years old, ask your health care provider if you should take aspirin to prevent heart disease.  Diabetes screening is done by taking a blood sample to check your blood glucose level after you have not eaten for a certain period of time (fasting). If you are not overweight and you do not have risk factors for  diabetes, you should be screened once every 3 years starting at age 11. If you are overweight or obese and you are 2-66 years of age, you should be screened for diabetes every year as part of your cardiovascular risk assessment.  Colorectal cancer can be detected and often prevented. Most routine colorectal cancer screening begins at the age of 45 and continues through age 93. However, your health care provider may recommend screening at an earlier age if you have risk factors for colon cancer. On a yearly basis, your health care provider may provide home test kits to check for hidden blood in the stool. Use of a small camera at the end of a tube to directly examine the colon (sigmoidoscopy or colonoscopy) can detect the earliest forms of colorectal cancer. Talk to your health care provider about this at age 58,  when routine screening begins. Direct exam of the colon should be repeated every 5-10 years through age 43, unless early forms of precancerous polyps or small growths are found.  People who are at an increased risk for hepatitis B should be screened for this virus. You are considered at high risk for hepatitis B if:  You were born in a country where hepatitis B occurs often. Talk with your health care provider about which countries are considered high risk.  Your parents were born in a high-risk country and you have not received a shot to protect against hepatitis B (hepatitis B vaccine).  You have HIV or AIDS.  You use needles to inject street drugs.  You live with, or have sex with, someone who has hepatitis B.  You are a man who has sex with other men (MSM).  You get hemodialysis treatment.  You take certain medicines for conditions such as cancer, organ transplantation, and autoimmune conditions.  Hepatitis C blood testing is recommended for all people born from [birth year] through 1964 and any individual with known risks for hepatitis C.  Practice safe sex. Use condoms and avoid  high-risk sexual practices to reduce the spread of sexually transmitted infections (STIs). STIs include gonorrhea, chlamydia, syphilis, trichomonas, herpes, HPV, and human immunodeficiency virus (HIV). Herpes, HIV, and HPV are viral illnesses that have no cure. They can result in disability, cancer, and death.  If you are a man who has sex with other men, you should be screened at least once per year for:  HIV.  Urethral, rectal, and pharyngeal infection of gonorrhea, chlamydia, or both.  If you are at risk of being infected with HIV, it is recommended that you take a prescription medicine daily to prevent HIV infection. This is called preexposure prophylaxis (PrEP). You are considered at risk if:  You are a man who has sex with other men (MSM) and have other risk factors.  You are a heterosexual man, are sexually active, and are at increased risk for HIV infection.  You take drugs by injection.  You are sexually active with a partner who has HIV.  Talk with your health care provider about whether you are at high risk of being infected with HIV. If you choose to begin PrEP, you should first be tested for HIV. You should then be tested every 3 months for as long as you are taking PrEP.  A one-time screening for abdominal aortic aneurysm (AAA) and surgical repair of large AAAs by ultrasound are recommended for men ages 8 to 63 years who are current or former smokers.  Healthy men should no longer receive prostate-specific antigen (PSA) blood tests as part of routine cancer screening. Talk with your health care provider about prostate cancer screening.  Testicular cancer screening is not recommended for adult males who have no symptoms. Screening includes self-exam, a health care provider exam, and other screening tests. Consult with your health care provider about any symptoms you have or any concerns you have about testicular cancer.  Use sunscreen. Apply sunscreen liberally and repeatedly  throughout the day. You should seek shade when your shadow is shorter than you. Protect yourself by wearing long sleeves, pants, a wide-brimmed hat, and sunglasses year round, whenever you are outdoors.  Once a month, do a whole-body skin exam, using a mirror to look at the skin on your back. Tell your health care provider about new moles, moles that have irregular borders, moles that are larger than a pencil eraser, or  moles that have changed in shape or color.  Stay current with required vaccines (immunizations).  Influenza vaccine. All adults should be immunized every year.  Tetanus, diphtheria, and acellular pertussis (Td, Tdap) vaccine. An adult who has not previously received Tdap or who does not know his vaccine status should receive 1 dose of Tdap. This initial dose should be followed by tetanus and diphtheria toxoids (Td) booster doses every 10 years. Adults with an unknown or incomplete history of completing a 3-dose immunization series with Td-containing vaccines should begin or complete a primary immunization series including a Tdap dose. Adults should receive a Td booster every 10 years.  Varicella vaccine. An adult without evidence of immunity to varicella should receive 2 doses or a second dose if he has previously received 1 dose.  Human papillomavirus (HPV) vaccine. Males aged 11-21 years who have not received the vaccine previously should receive the 3-dose series. Males aged 22-26 years may be immunized. Immunization is recommended through the age of 45 years for any male who has sex with males and did not get any or all doses earlier. Immunization is recommended for any person with an immunocompromised condition through the age of 41 years if he did not get any or all doses earlier. During the 3-dose series, the second dose should be obtained 4-8 weeks after the first dose. The third dose should be obtained 24 weeks after the first dose and 16 weeks after the second dose.  Zoster  vaccine. One dose is recommended for adults aged 27 years or older unless certain conditions are present.  Measles, mumps, and rubella (MMR) vaccine. Adults born before [birth year] generally are considered immune to measles and mumps. Adults born in [birth year] or later should have 1 or more doses of MMR vaccine unless there is a contraindication to the vaccine or there is laboratory evidence of immunity to each of the three diseases. A routine second dose of MMR vaccine should be obtained at least 28 days after the first dose for students attending postsecondary schools, health care workers, or international travelers. People who received inactivated measles vaccine or an unknown type of measles vaccine during 1963-1967 should receive 2 doses of MMR vaccine. People who received inactivated mumps vaccine or an unknown type of mumps vaccine before 1979 and are at high risk for mumps infection should consider immunization with 2 doses of MMR vaccine. Unvaccinated health care workers born before [birth year] who lack laboratory evidence of measles, mumps, or rubella immunity or laboratory confirmation of disease should consider measles and mumps immunization with 2 doses of MMR vaccine or rubella immunization with 1 dose of MMR vaccine.  Pneumococcal 13-valent conjugate (PCV13) vaccine. When indicated, a person who is uncertain of his immunization history and has no record of immunization should receive the PCV13 vaccine. All adults 73 years of age and older should receive this vaccine. An adult aged 13 years or older who has certain medical conditions and has not been previously immunized should receive 1 dose of PCV13 vaccine. This PCV13 should be followed with a dose of pneumococcal polysaccharide (PPSV23) vaccine. Adults who are at high risk for pneumococcal disease should obtain the PPSV23 vaccine at least 8 weeks after the dose of PCV13 vaccine. Adults older than 31 years of age who have normal immune system function should obtain  the PPSV23 vaccine dose at least 1 year after the dose of PCV13 vaccine.  Pneumococcal polysaccharide (PPSV23) vaccine. When PCV13 is also indicated, PCV13 should be obtained first. All  adults aged 72 years and older should be immunized. An adult younger than age 31 years who has certain medical conditions should be immunized. Any person who resides in a nursing home or long-term care facility should be immunized. An adult smoker should be immunized. People with an immunocompromised condition and certain other conditions should receive both PCV13 and PPSV23 vaccines. People with human immunodeficiency virus (HIV) infection should be immunized as soon as possible after diagnosis. Immunization during chemotherapy or radiation therapy should be avoided. Routine use of PPSV23 vaccine is not recommended for American Indians, [place] Natives, or people younger than 65 years unless there are medical conditions that require PPSV23 vaccine. When indicated, people who have unknown immunization and have no record of immunization should receive PPSV23 vaccine. One-time revaccination 5 years after the first dose of PPSV23 is recommended for people aged 19-64 years who have chronic kidney failure, nephrotic syndrome, asplenia, or immunocompromised conditions. People who received 1-2 doses of PPSV23 before age 3 years should receive another dose of PPSV23 vaccine at age 67 years or later if at least 5 years have passed since the previous dose. Doses of PPSV23 are not needed for people immunized with PPSV23 at or after age 67 years.  Meningococcal vaccine. Adults with asplenia or persistent complement component deficiencies should receive 2 doses of quadrivalent meningococcal conjugate (MenACWY-D) vaccine. The doses should be obtained at least 2 months apart. Microbiologists working with certain meningococcal bacteria, White recruits, people at risk during an outbreak, and people who travel to or live in countries with a  high rate of meningitis should be immunized. A first-year college student up through age 53 years who is living in a residence hall should receive a dose if he did not receive a dose on or after his 16th birthday. Adults who have certain high-risk conditions should receive one or more doses of vaccine.  Hepatitis A vaccine. Adults who wish to be protected from this disease, have chronic liver disease, work with hepatitis A-infected animals, work in hepatitis A research labs, or travel to or work in countries with a high rate of hepatitis A should be immunized. Adults who were previously unvaccinated and who anticipate close contact with an international adoptee during the first 60 days after arrival in the [place] from a country with a high rate of hepatitis A should be immunized.  Hepatitis B vaccine. Adults should be immunized if they wish to be protected from this disease, are under age 6 years and have diabetes, have chronic liver disease, have had more than one sex partner in the past 6 months, may be exposed to blood or other infectious body fluids, are household contacts or sex partners of hepatitis B positive people, are clients or workers in certain care facilities, or travel to or work in countries with a high rate of hepatitis B.  Haemophilus influenzae type b (Hib) vaccine. A previously unvaccinated person with asplenia or sickle cell disease or having a scheduled splenectomy should receive 1 dose of Hib vaccine. Regardless of previous immunization, a recipient of a hematopoietic stem cell transplant should receive a 3-dose series 6-12 months after his successful transplant. Hib vaccine is not recommended for adults with HIV infection. Preventive Service / Frequency Ages 42 to 43  Blood pressure check.** / Every 3-5 years.  Lipid and cholesterol check.** / Every 5 years beginning at age 52.  Hepatitis C blood test.** / For any individual with known risks for hepatitis C.  Skin  self-exam. /  Monthly.  Influenza vaccine. / Every year.  Tetanus, diphtheria, and acellular pertussis (Tdap, Td) vaccine.** / Consult your health care provider. 1 dose of Td every 10 years.  Varicella vaccine.** / Consult your health care provider.  HPV vaccine. / 3 doses over 6 months, if 36 or younger.  Measles, mumps, rubella (MMR) vaccine.** / You need at least 1 dose of MMR if you were born in 1957 or later. You may also need a second dose.  Pneumococcal 13-valent conjugate (PCV13) vaccine.** / Consult your health care provider.  Pneumococcal polysaccharide (PPSV23) vaccine.** / 1 to 2 doses if you smoke cigarettes or if you have certain conditions.  Meningococcal vaccine.** / 1 dose if you are age 52 to 32 years and a Market researcher living in a residence hall, or have one of several medical conditions. You may also need additional booster doses.  Hepatitis A vaccine.** / Consult your health care provider.  Hepatitis B vaccine.** / Consult your health care provider.  Haemophilus influenzae type b (Hib) vaccine.** / Consult your health care provider. Ages 77 to 49  Blood pressure check.** / Every year.  Lipid and cholesterol check.** / Every 5 years beginning at age 51.  Lung cancer screening. / Every year if you are aged 24-80 years and have a 30-pack-year history of smoking and currently smoke or have quit within the past 15 years. Yearly screening is stopped once you have quit smoking for at least 15 years or develop a health problem that would prevent you from having lung cancer treatment.  Fecal occult blood test (FOBT) of stool. / Every year beginning at age 81 and continuing until age 51. You may not have to do this test if you get a colonoscopy every 10 years.  Flexible sigmoidoscopy** or colonoscopy.** / Every 5 years for a flexible sigmoidoscopy or every 10 years for a colonoscopy beginning at age 61 and continuing until age 35.  Hepatitis C blood test.**  / For all people born from [birth year] through 1964 and any individual with known risks for hepatitis C.  Skin self-exam. / Monthly.  Influenza vaccine. / Every year.  Tetanus, diphtheria, and acellular pertussis (Tdap/Td) vaccine.** / Consult your health care provider. 1 dose of Td every 10 years.  Varicella vaccine.** / Consult your health care provider.  Zoster vaccine.** / 1 dose for adults aged 54 years or older.  Measles, mumps, rubella (MMR) vaccine.** / You need at least 1 dose of MMR if you were born in 1957 or later. You may also need a second dose.  Pneumococcal 13-valent conjugate (PCV13) vaccine.** / Consult your health care provider.  Pneumococcal polysaccharide (PPSV23) vaccine.** / 1 to 2 doses if you smoke cigarettes or if you have certain conditions.  Meningococcal vaccine.** / Consult your health care provider.  Hepatitis A vaccine.** / Consult your health care provider.  Hepatitis B vaccine.** / Consult your health care provider.  Haemophilus influenzae type b (Hib) vaccine.** / Consult your health care provider. Ages 54 and over  Blood pressure check.** / Every year.  Lipid and cholesterol check.**/ Every 5 years beginning at age 36.  Lung cancer screening. / Every year if you are aged 79-80 years and have a 30-pack-year history of smoking and currently smoke or have quit within the past 15 years. Yearly screening is stopped once you have quit smoking for at least 15 years or develop a health problem that would prevent you from having lung cancer treatment.  Fecal occult blood  test (FOBT) of stool. / Every year beginning at age 41 and continuing until age 6. You may not have to do this test if you get a colonoscopy every 10 years.  Flexible sigmoidoscopy** or colonoscopy.** / Every 5 years for a flexible sigmoidoscopy or every 10 years for a colonoscopy beginning at age 63 and continuing until age 91.  Hepatitis C blood test.** / For all people born from [birth year]  through 1965 and any individual with known risks for hepatitis C.  Abdominal aortic aneurysm (AAA) screening.** / A one-time screening for ages 52 to 77 years who are current or former smokers.  Skin self-exam. / Monthly.  Influenza vaccine. / Every year.  Tetanus, diphtheria, and acellular pertussis (Tdap/Td) vaccine.** / 1 dose of Td every 10 years.  Varicella vaccine.** / Consult your health care provider.  Zoster vaccine.** / 1 dose for adults aged 56 years or older.  Pneumococcal 13-valent conjugate (PCV13) vaccine.** / 1 dose for all adults aged 36 years and older.  Pneumococcal polysaccharide (PPSV23) vaccine.** / 1 dose for all adults aged 26 years and older.  Meningococcal vaccine.** / Consult your health care provider.  Hepatitis A vaccine.** / Consult your health care provider.  Hepatitis B vaccine.** / Consult your health care provider.  Haemophilus influenzae type b (Hib) vaccine.** / Consult your health care provider. **Family history and personal history of risk and conditions may change your health care provider's recommendations.   This information is not intended to replace advice given to you by your health care provider. Make sure you discuss any questions you have with your health care provider.   Document Released: 06/18/2001 Document Revised: 05/13/2014 Document Reviewed: 09/17/2010 Elsevier Interactive Patient Education [employer].

## 2018-01-02 LAB — TSH: TSH: 3.19 u[IU]/mL (ref 0.450–4.500)

## 2018-01-02 LAB — CBC WITH DIFFERENTIAL/PLATELET
Basophils Absolute: 0 10*3/uL (ref 0.0–0.2)
Basos: 0 %
EOS (ABSOLUTE): 0.1 10*3/uL (ref 0.0–0.4)
Eos: 2 %
Hematocrit: 40.9 % (ref 37.5–51.0)
Hemoglobin: 14.2 g/dL (ref 13.0–17.7)
Immature Grans (Abs): 0 10*3/uL (ref 0.0–0.1)
Immature Granulocytes: 0 %
Lymphocytes Absolute: 1.8 10*3/uL (ref 0.7–3.1)
Lymphs: 35 %
MCH: 31.1 pg (ref 26.6–33.0)
MCHC: 34.7 g/dL (ref 31.5–35.7)
MCV: 90 fL (ref 79–97)
Monocytes Absolute: 0.5 10*3/uL (ref 0.1–0.9)
Monocytes: 10 %
Neutrophils Absolute: 2.8 10*3/uL (ref 1.4–7.0)
Neutrophils: 53 %
Platelets: 286 10*3/uL (ref 150–450)
RBC: 4.56 x10E6/uL (ref 4.14–5.80)
RDW: 12.4 % (ref 12.3–15.4)
WBC: 5.3 10*3/uL (ref 3.4–10.8)

## 2018-01-02 LAB — LIPID PANEL
Chol/HDL Ratio: 2.3 ratio (ref 0.0–5.0)
Cholesterol, Total: 139 mg/dL (ref 100–199)
HDL: 61 mg/dL (ref >39)
LDL Calculated: 65 mg/dL (ref 0–99)
Triglycerides: 63 mg/dL (ref 0–149)
VLDL Cholesterol Cal: 13 mg/dL (ref 5–40)

## 2018-01-02 LAB — HEMOGLOBIN A1C
Est. average glucose Bld gHb Est-mCnc: 103 mg/dL
Hgb A1c MFr Bld: 5.2 % (ref 4.8–5.6)

## 2018-01-02 LAB — COMPREHENSIVE METABOLIC PANEL
ALT: 19 IU/L (ref 0–44)
AST: 24 IU/L (ref 0–40)
Albumin/Globulin Ratio: 2 (ref 1.2–2.2)
Albumin: 4.6 g/dL (ref 3.5–5.5)
Alkaline Phosphatase: 47 IU/L (ref 39–117)
BUN/Creatinine Ratio: 15 (ref 9–20)
BUN: 15 mg/dL (ref 6–20)
Bilirubin Total: 0.5 mg/dL (ref 0.0–1.2)
CO2: 23 mmol/L (ref 20–29)
Calcium: 9.4 mg/dL (ref 8.7–10.2)
Chloride: 103 mmol/L (ref 96–106)
Creatinine, Ser: 0.98 mg/dL (ref 0.76–1.27)
GFR calc Af Amer: 118 mL/min/{1.73_m2} (ref >59)
GFR calc non Af Amer: 102 mL/min/{1.73_m2} (ref >59)
Globulin, Total: 2.3 g/dL (ref 1.5–4.5)
Glucose: 103 mg/dL — ABNORMAL HIGH (ref 65–99)
Potassium: 4 mmol/L (ref 3.5–5.2)
Sodium: 141 mmol/L (ref 134–144)
Total Protein: 6.9 g/dL (ref 6.0–8.5)

## 2018-01-02 LAB — VITAMIN D 25 HYDROXY (VIT D DEFICIENCY, FRACTURES): Vit D, 25-Hydroxy: 30.4 ng/mL (ref 30.0–100.0)

## 2018-01-02 LAB — T4, FREE: Free T4: 1.46 ng/dL (ref 0.82–1.77)

## 2018-01-07 ENCOUNTER — Ambulatory Visit
Admission: RE | Admit: 2018-01-07 | Discharge: 2018-01-07 | Disposition: A | Discharge status: Home / Self Care | Payer: BLUE CROSS/BLUE SHIELD | Source: Ambulatory Visit | Attending: Family Medicine | Admitting: Family Medicine

## 2018-01-07 DIAGNOSIS — N6320 Unspecified lump in the left breast, unspecified quadrant: Secondary | ICD-10-CM

## 2018-01-13 ENCOUNTER — Other Ambulatory Visit: Payer: Self-pay

## 2018-01-13 DIAGNOSIS — N62 Hypertrophy of breast: Secondary | ICD-10-CM

## 2018-06-30 ENCOUNTER — Ambulatory Visit: Payer: BLUE CROSS/BLUE SHIELD | Admitting: Family Medicine

## 2018-07-13 ENCOUNTER — Encounter: Payer: Self-pay | Admitting: Family Medicine

## 2018-07-13 ENCOUNTER — Ambulatory Visit (INDEPENDENT_AMBULATORY_CARE_PROVIDER_SITE_OTHER): Payer: BLUE CROSS/BLUE SHIELD | Admitting: Family Medicine

## 2018-07-13 VITALS — BP 121/86 | HR 85 | Ht 71.0 in | Wt 160.0 lb

## 2018-07-13 DIAGNOSIS — F39 Unspecified mood [affective] disorder: Secondary | ICD-10-CM | POA: Insufficient documentation

## 2018-07-13 DIAGNOSIS — F4323 Adjustment disorder with mixed anxiety and depressed mood: Secondary | ICD-10-CM | POA: Diagnosis not present

## 2018-07-13 DIAGNOSIS — Z719 Counseling, unspecified: Secondary | ICD-10-CM

## 2018-07-13 MED ORDER — FLUOXETINE HCL 10 MG PO TABS: ORAL_TABLET | ORAL | 0 refills | Status: DC

## 2018-07-13 MED ORDER — ESCITALOPRAM OXALATE 5 MG PO TABS: ORAL_TABLET | ORAL | 0 refills | Status: DC

## 2018-07-13 NOTE — Progress Notes (Signed)
Impression and Recommendations:    1. Mood disorder (HCC)   2. Health education/counseling   3. Adjustment disorder with mixed anxiety and depressed mood     1. Mood Disorder - Mixed Anxiety & Depressed Mood - Discussed symptoms and counseled patient regarding symptoms in depth today.  - Begin low-dose Prozac.  - Per patient, used Lexapro in the past. - Begin low-dose Lexapro.  See med list.  - Discussed that Prozac and Lexapro together will work in synergy. - Possible side-effects, risks, and benefits discussed with patient today.  - Reviewed the "spokes of the wheel" of mood and health management.  Stressed the importance of ongoing prudent habits, including regular exercise, appropriate sleep hygiene, healthful dietary habits, and prayer/meditation to relax.  - Strongly encouraged patient to develop other methods to cope with stress.  - Extensively reviewed the need to eat healthfully, low carb, high protein, high fiber, fresh fruits and vegetables.  Encouraged patient to engage in meal prep to help simplify his weekly eating routines.  - Encouraged patient to continue with smoking cessation goals.  - Encouraged patient to work on exercising daily, to improve overall physical, mental, and emotional well-being.  Advised patient to work toward 150-300 minutes of cardiovascular activity per week.  - Encouraged patient to keep a daily journal to cross off tasks once they are completed.  - Advised patient to consume adequate amounts of water.   Meds ordered this encounter  Medications  . FLUoxetine (PROZAC) 10 MG tablet    Sig: Use one half tablet daily for 1 week then increase to 1 tablet q am    Dispense:  90 tablet    Refill:  0  . escitalopram (LEXAPRO) 5 MG tablet    Sig: Take 1 tablet (5 mg total) by mouth at bedtime for 7 days, THEN 2 tablets (10 mg total) at bedtime.    Dispense:  180 tablet    Refill:  0    There are no discontinued medications.   Gross  side effects, risk and benefits, and alternatives of medications and treatment plan in general discussed with patient.  Patient is aware that all medications have potential side effects and we are unable to predict every side effect or drug-drug interaction that may occur.   Patient will call with any questions prior to using medication if they have concerns.    Expresses verbal understanding and consents to current therapy and treatment regimen.  No barriers to understanding were identified.  Red flag symptoms and signs discussed in detail.  Patient expressed understanding regarding what to do in case of emergency\urgent symptoms  Please see AVS handed out to patient at the end of our visit for further patient instructions/ counseling done pertaining to today's office visit.   Return for 2) f/up in 8 wks for reck of emotions.     Note:  This note was prepared with assistance of Dragon voice recognition software. Occasional wrong-word or sound-a-like substitutions may have occurred due to the inherent limitations of voice recognition software.   This document serves as a record of services personally performed by Philip Lot, DO. It was created on her behalf by Philip Todd, a trained medical scribe. The creation of this record is based on the scribe's personal observations and the provider's statements to them.   I have reviewed the above medical documentation for accuracy and completeness and I concur.  Philip Lot, DO 07/14/2018 7:13 PM  Pt was interviewed and evaluated by me in the clinic today for 32.5+ minutes, with over 50% time spent in face to face counseling of patients various medical conditions, treatment plans of those medical conditions including medicine management and lifestyle modification, strategies to improve health and well being; and in coordination of care. SEE ABOVE TREATMENT PLAN FOR  DETAILS   ------------------------------------------------------------------------------------------------------------------------------------    Subjective:     HPI: Philip Todd is a 33 y.o. male who presents to [facility] today for issues as discussed below.  Recently bought a new house, got a new job, and notes "life should be good on paper, but ..."  He's also taking on additional responsibility at work and "feels like his head is in ten places at once."  He's concerned about performance at work, and mood at home.  "[name] says I need to talk to you."  Note "things are really good but my head is not feeling good."  He confirms he feels both depressed and anxious.  Notes that sometimes he feels bummed out because of his lack of drive when he's not at work.  Additionally states he's feeling on edge and impatient.  "My patience is so thin.  Sometimes I lash out.  The littlest things will set me off."  He's struggling to quit smoking weed, feeling irritable, and has "no drive."  Notes "I get my job done, but when I get home, that's it."  For the last two weekends, notes he didn't do anything, he just laid in bed the whole weekend with no desire to move.  "I don't know why."  Says "I don't care to move."  He smokes weed a couple of times per week, not daily anymore, "but the idea of completely stopping gives me anxiety."  He took Lexapro for mood management in the past but is unsure if it worked or not.  Notes "I know I was against taking it."  Says he felt forced to take it, "because I was told to" as a kid.  States he's "scared of feeling like he has to have [medicine]."  He smokes cigarettes socially, and drinks socially.  Notes he doesn't drink daily.  When he smokes weed, he feels at ease.  When he's not smoking weed, states he feels more depressed.   He's afraid of addiction "to things aside from the things I'm already addicted to."  Notes that the only  time he wasn't smoking weed in the past, he was exercising daily, before they had kids, before he had a regular job.  He has two daughters, age 81 and 53.  Reports that he wishes to be a good role model for them.      Depression screen [facility] 2/9 07/13/2018  Decreased Interest 2  Down, Depressed, Hopeless 2  PHQ - 2 Score 4  Altered sleeping 2  Tired, decreased energy 2  Change in appetite 2  Feeling bad or failure about yourself  0  Trouble concentrating 2  Moving slowly or fidgety/restless 0  Suicidal thoughts 0  PHQ-9 Score 12     Wt Readings from Last 3 Encounters:  07/13/18 160 lb (72.6 kg)  01/01/18 155 lb 12.8 oz (70.7 kg)  10/23/16 167 lb 8 oz (76 kg)   BP Readings from Last 3 Encounters:  07/13/18 121/86  01/01/18 (!) 135/91  10/23/16 126/82   Pulse Readings from Last 3 Encounters:  07/13/18 85  01/01/18 (!) 106  10/23/16 98  BMI Readings from Last 3 Encounters:  07/13/18 22.32 kg/m  01/01/18 21.73 kg/m  10/23/16 23.20 kg/m     Patient Care Team    Relationship Specialty Notifications Start End  Philip Lot, DO PCP - General Family Medicine  10/23/16      Patient Active Problem List   Diagnosis Date Noted  . Nicotine dependence 10/23/2016    Priority: High  . Trauma in childhood 10/23/2016    Priority: High  . Marijuana use, continuous 10/23/2016    Priority: Medium  . Mood disorder (HCC) 07/13/2018  . Adjustment disorder with mixed anxiety and depressed mood 07/13/2018  . Indirect left inguinal hernia 01/01/2018  . Breast mass, left 01/01/2018  . Screening for multiple conditions 10/23/2016  . Health education/counseling 10/23/2016  . History of posttraumatic stress disorder (PTSD)- as teen 10/23/2016  . H/O: attempted suicide as teen 10/23/2016    Past Medical history, Surgical history, Family history, Social history, Allergies and Medications have been entered into the medical record, reviewed and changed as needed.    No outpatient  medications have been marked as taking for the 07/13/18 encounter (Office Visit) with Philip Lot, DO.    Allergies:  Allergies  Allergen Reactions  . Penicillins      Review of Systems:  A fourteen system review of systems was performed and found to be positive as per HPI.   Objective:   Blood pressure 121/86, pulse 85, height 5\' 11"  (1.803 m), weight 160 lb (72.6 kg), SpO2 98 %. Body mass index is 22.32 kg/m. General:  Well Developed, well nourished, appropriate for stated age.  Neuro:  Alert and oriented,  extra-ocular muscles intact  HEENT:  Normocephalic, atraumatic, neck supple, no carotid bruits appreciated  Skin:  no gross rash, warm, pink. Cardiac:  RRR, S1 S2 Respiratory:  ECTA B/L and A/P, Not using accessory muscles, speaking in full sentences- unlabored. Vascular:  Ext warm, no cyanosis apprec.; cap RF less 2 sec. Psych:  No HI/SI, judgement and insight good, Euthymic mood. Full Affect.

## 2018-07-13 NOTE — Patient Instructions (Addendum)
These things below can help with your mood.  In addition to the below, please make sure you are doing some type of breathing / meditation for stress.  Also as I discussed, exercise of 30 minutes daily would be great.      1)  write 3 new things that you are grateful for every day for 21 days  2)  exercise daily- walk for 15 minutes twice a day every day 3)  you are going to journal every day about one positive experience that you had 4)  meditate every day.  You can go on YouTube and look for 15-minute relaxation meditation or what ever.  But we need to make sure that you are in the moment and relaxing and deep breathing every day 5)  Write 1 positive email every day to praise someone in your life     - If you have insomnia or difficulty sleeping, this information is for you:  - Avoid caffeinated beverages after lunch,  no alcoholic beverages,  no eating within 2-3 hours of lying down,  avoid exposure to blue light before bed,  avoid daytime naps, and  needs to maintain a regular sleep schedule- go to sleep and wake up around the same time every night.   - Resolve concerns or worries before entering bedroom:  Discussed relaxation techniques with patient and to keep a journal to write down fears\ worries.  I suggested seeing a counselor for CBT.   - Recommend patient meditate or do deep breathing exercises to help relax.   Incorporate the use of white noise machines or listen to "sleep meditation music", or recordings of guided meditations for sleep from YouTube which are free, such as  "guided meditation for detachment from over thinking"  by Ina Kick.         Generalized Anxiety Disorder, Adult  Generalized anxiety disorder (GAD) is a mental health disorder. People with this condition constantly worry about everyday events. Unlike normal anxiety, worry related to GAD is not triggered by a specific event. These worries also do not fade or get better with time. GAD interferes  with life functions, including relationships, work, and school. GAD can vary from mild to severe. People with severe GAD can have intense waves of anxiety with physical symptoms (panic attacks). What are the causes? The exact cause of GAD is not known. What increases the risk? This condition is more likely to develop in:  Women.  People who have a family history of anxiety disorders.  People who are very shy.  People who experience very stressful life events, such as the death of a loved one.  People who have a very stressful family environment.  What are the signs or symptoms? People with GAD often worry excessively about many things in their lives, such as their health and family. They may also be overly concerned about:  Doing well at work.  Being on time.  Natural disasters.  Friendships.  Physical symptoms of GAD include:  Fatigue.  Muscle tension or having muscle twitches.  Trembling or feeling shaky.  Being easily startled.  Feeling like your heart is pounding or racing.  Feeling out of breath or like you cannot take a deep breath.  Having trouble falling asleep or staying asleep.  Sweating.  Nausea, diarrhea, or irritable bowel syndrome (IBS).  Headaches.  Trouble concentrating or remembering facts.  Restlessness.  Irritability.  How is this diagnosed? Your health care provider can diagnose GAD based on your symptoms and  medical history. You will also have a physical exam. The health care provider will ask specific questions about your symptoms, including how severe they are, when they started, and if they come and go. Your health care provider may ask you about your use of alcohol or drugs, including prescription medicines. Your health care provider may refer you to a mental health specialist for further evaluation. Your health care provider will do a thorough examination and may perform additional tests to rule out other possible causes of your  symptoms. To be diagnosed with GAD, a person must have anxiety that:  Is out of his or her control.  Affects several different aspects of his or her life, such as work and relationships.  Causes distress that makes him or her unable to take part in normal activities.  Includes at least three physical symptoms of GAD, such as restlessness, fatigue, trouble concentrating, irritability, muscle tension, or sleep problems.  Before your health care provider can confirm a diagnosis of GAD, these symptoms must be present more days than they are not, and they must last for six months or longer. How is this treated? The following therapies are usually used to treat GAD:  Medicine. Antidepressant medicine is usually prescribed for long-term daily control. Antianxiety medicines may be added in severe cases, especially when panic attacks occur.  Talk therapy (psychotherapy). Certain types of talk therapy can be helpful in treating GAD by providing support, education, and guidance. Options include: ? Cognitive behavioral therapy (CBT). People learn coping skills and techniques to ease their anxiety. They learn to identify unrealistic or negative thoughts and behaviors and to replace them with positive ones. ? Acceptance and commitment therapy (ACT). This treatment teaches people how to be mindful as a way to cope with unwanted thoughts and feelings. ? Biofeedback. This process trains you to manage your body's response (physiological response) through breathing techniques and relaxation methods. You will work with a therapist while machines are used to monitor your physical symptoms.  Stress management techniques. These include yoga, meditation, and exercise.  A mental health specialist can help determine which treatment is best for you. Some people see improvement with one type of therapy. However, other people require a combination of therapies. Follow these instructions at home:  Take over-the-counter  and prescription medicines only as told by your health care provider.  Try to maintain a normal routine.  Try to anticipate stressful situations and allow extra time to manage them.  Practice any stress management or self-calming techniques as taught by your health care provider.  Do not punish yourself for setbacks or for not making progress.  Try to recognize your accomplishments, even if they are small.  Keep all follow-up visits as told by your health care provider. This is important. Contact a health care provider if:  Your symptoms do not get better.  Your symptoms get worse.  You have signs of depression, such as: ? A persistently sad, cranky, or irritable mood. ? Loss of enjoyment in activities that used to bring you joy. ? Change in weight or eating. ? Changes in sleeping habits. ? Avoiding friends or family members. ? Loss of energy for normal tasks. ? Feelings of guilt or worthlessness. Get help right away if:  You have serious thoughts about hurting yourself or others. If you ever feel like you may hurt yourself or others, or have thoughts about taking your own life, get help right away. You can go to your nearest emergency department or call:  Your local emergency services (911 in the [place]  A suicide crisis helpline, such as the National Suicide Prevention Lifeline at (915)487-02711-424-602-6160. This is open 24 hours a day.  Summary  Generalized anxiety disorder (GAD) is a mental health disorder that involves worry that is not triggered by a specific event.  People with GAD often worry excessively about many things in their lives, such as their health and family.  GAD may cause physical symptoms such as restlessness, trouble concentrating, sleep problems, frequent sweating, nausea, diarrhea, headaches, and trembling or muscle twitching.  A mental health specialist can help determine which treatment is best for you. Some people see improvement with one type of therapy.  However, other people require a combination of therapies. This information is not intended to replace advice given to you by your health care provider. Make sure you discuss any questions you have with your health care provider. Document Released: 08/17/2012 Document Revised: 03/12/2016 Document Reviewed: 03/12/2016 Elsevier Interactive Patient Education  2018 [employer]      Living With Anxiety  After being diagnosed with an anxiety disorder, you may be relieved to know why you have felt or behaved a certain way. It is natural to also feel overwhelmed about the treatment ahead and what it will mean for your life. With care and support, you can manage this condition and recover from it. How to cope with anxiety Dealing with stress Stress is your body's reaction to life changes and events, both good and bad. Stress can last just a few hours or it can be ongoing. Stress can play a major role in anxiety, so it is important to learn both how to cope with stress and how to think about it differently. Talk with your health care provider or a counselor to learn more about stress reduction. He or she may suggest some stress reduction techniques, such as:  Music therapy. This can include creating or listening to music that you enjoy and that inspires you.  Mindfulness-based meditation. This involves being aware of your normal breaths, rather than trying to control your breathing. It can be done while sitting or walking.  Centering prayer. This is a kind of meditation that involves focusing on a word, phrase, or sacred image that is meaningful to you and that brings you peace.  Deep breathing. To do this, expand your stomach and inhale slowly through your nose. Hold your breath for 3-5 seconds. Then exhale slowly, allowing your stomach muscles to relax.  Self-talk. This is a skill where you identify thought patterns that lead to anxiety reactions and correct those thoughts.  Muscle relaxation.  This involves tensing muscles then relaxing them.  Choose a stress reduction technique that fits your lifestyle and personality. Stress reduction techniques take time and practice. Set aside 5-15 minutes a day to do them. Therapists can offer training in these techniques. The training may be covered by some insurance plans. Other things you can do to manage stress include:  Keeping a stress diary. This can help you learn what triggers your stress and ways to control your response.  Thinking about how you respond to certain situations. You may not be able to control everything, but you can control your reaction.  Making time for activities that help you relax, and not feeling guilty about spending your time in this way.  Therapy combined with coping and stress-reduction skills provides the best chance for successful treatment. Medicines Medicines can help ease symptoms. Medicines for anxiety include:  Anti-anxiety drugs.  Antidepressants.  Beta-blockers.  Medicines may be used as the main treatment for anxiety disorder, along with therapy, or if other treatments are not working. Medicines should be prescribed by a health care provider. Relationships Relationships can play a big part in helping you recover. Try to spend more time connecting with trusted friends and family members. Consider going to couples counseling, taking family education classes, or going to family therapy. Therapy can help you and others better understand the condition. How to recognize changes in your condition Everyone has a different response to treatment for anxiety. Recovery from anxiety happens when symptoms decrease and stop interfering with your daily activities at home or work. This may mean that you will start to:  Have better concentration and focus.  Sleep better.  Be less irritable.  Have more energy.  Have improved memory.  It is important to recognize when your condition is getting worse. Contact  your health care provider if your symptoms interfere with home or work and you do not feel like your condition is improving. Where to find help and support: You can get help and support from these sources:  Self-help groups.  Online and [employer].  A trusted spiritual leader.  Couples counseling.  Family education classes.  Family therapy.  Follow these instructions at home:  Eat a healthy diet that includes plenty of vegetables, fruits, whole grains, low-fat dairy products, and lean protein. Do not eat a lot of foods that are high in solid fats, added sugars, or salt.  Exercise. Most adults should do the following: ? Exercise for at least 150 minutes each week. The exercise should increase your heart rate and make you sweat (moderate-intensity exercise). ? Strengthening exercises at least twice a week.  Cut down on caffeine, tobacco, alcohol, and other potentially harmful substances.  Get the right amount and quality of sleep. Most adults need 7-9 hours of sleep each night.  Make choices that simplify your life.  Take over-the-counter and prescription medicines only as told by your health care provider.  Avoid caffeine, alcohol, and certain over-the-counter cold medicines. These may make you feel worse. Ask your pharmacist which medicines to avoid.  Keep all follow-up visits as told by your health care provider. This is important. Questions to ask your health care provider  Would I benefit from therapy?  How often should I follow up with a health care provider?  How long do I need to take medicine?  Are there any long-term side effects of my medicine?  Are there any alternatives to taking medicine? Contact a health care provider if:  You have a hard time staying focused or finishing daily tasks.  You spend many hours a day feeling worried about everyday life.  You become exhausted by worry.  You start to have headaches, feel tense, or have  nausea.  You urinate more than normal.  You have diarrhea. Get help right away if:  You have a racing heart and shortness of breath.  You have thoughts of hurting yourself or others. If you ever feel like you may hurt yourself or others, or have thoughts about taking your own life, get help right away. You can go to your nearest emergency department or call:  Your local emergency services (911 in the [place]  A suicide crisis helpline, such as the National Suicide Prevention Lifeline at 831-378-2330. This is open 24-hours a day.  Summary  Taking steps to deal with stress can help calm you.  Medicines cannot cure anxiety  disorders, but they can help ease symptoms.  Family, friends, and partners can play a big part in helping you recover from an anxiety disorder. This information is not intended to replace advice given to you by your health care provider. Make sure you discuss any questions you have with your health care provider. Document Released: 04/16/2016 Document Revised: 04/16/2016 Document Reviewed: 04/16/2016 Elsevier Interactive Patient Education  2018 [employer].      Recovering From Addiction Addiction is a complex disease of the brain. It causes an uncontrollable (compulsive) need for a substance. You can be addicted to substances including alcohol, tobacco, illegal drugs, or prescription medicines such as painkillers. Addiction can change the way that your brain works. It affects memory, behavior, and how you make decisions. Without treatment, addiction can get worse. However, with treatment and lifestyle changes, you can recover from addiction. What types of treatment are available? The treatment program that is right for you will depend on many factors, including the type of addiction that you have. Treatment programs can be outpatient or inpatient. In an outpatient program, you live at home and go to work or school, but you also go to a clinic for treatment. With  an inpatient program, you live and sleep at the program facility during treatment. Other treatment options include:  Medicine. ? Some addictions may be treated with prescription medicines. ? You may also need medicine to treat other mental health conditions such as anxiety or depression.  Counseling and behavior therapy. Therapy can help you learn new ways to respond to situations that are stressful or that tempt you to use the addictive substance.  Support groups. These include therapy groups and 12-step programs. These can help individuals and families during treatment and recovery. Examples of 12-step programs are Alcoholics Anonymous (AA) and Narcotics Anonymous (NA). How to manage lifestyle changes Managing stress Too much stress can lead to returning to the addiction (having a relapse). You need to find effective ways to manage your stress. Some techniques to cope with stress include:  Meditation, yoga, or deep breathing.  Exercise. Create an exercise routine that you enjoy and that allows you to work off some energy.  Creating or listening to music.  Muscle relaxation exercises.  Medicines Some medicines may make you feel calmer and help you have fewer cravings. If your health care provider prescribes medicines, make sure you:  Avoid using alcohol and other substances that may prevent your medicines from working properly (may interact).  Talk with your pharmacist or health care provider about all medicines that you take, the possible problems (side effects) that they can cause, and which medicines are safe to take together.  Make it your goal to take part in all treatment decisions (shared decision-making). Ask about possible side effects of medicines that your health care provider recommends, and tell him or her how you feel about having those side effects. It is best if shared decision-making with your health care provider is part of your total treatment  plan. Relationships Supportive relationships are very important in your recovery. When you are recovering from drug addiction, it will be important to avoid being around people who use drugs. For many people, this means developing new and different relationships. Some ways to do this include:  Developing trusting relationships with the people you meet in treatment or in AA or NA. These people share your desire to stop using substances (get sober) and to stay sober.  Getting a sponsor as a primary support person  if you are attending a 12-step program.  Building relationships with people you meet through activities such as hobbies, volunteering, or exercising. How to recognize changes in your condition When recovering from an addiction, it is very common for a person to relapse and start using the substance again. Contact your sponsor, therapist, or health care provider to seek additional help if you experience the following:  Anxiety.  Excessive anger.  Isolating yourself from others.  Trouble sleeping.  Feeling depressed.  Loss of appetite.  Fantasies about using the substance. Where to find support Talking to others  You may be advised to see a family therapist along with members of your family. Family therapy can help you and your family understand what led you to addiction. Talk with your family about this approach.  Let your family members or friends know that they can help you through treatment. Support from loved ones will be important to help you maintain positive changes. Financial resources Be sure to check with your insurance carrier to find out what treatment options are covered by your plan. You may also be able to find financial assistance through not-for-profit organizations or with local government-based resources. If you are taking medicines, you may be able to get the generic form, which may be less expensive than brand-name medicine. Some makers of prescription  medicines also offer help to patients who cannot afford the medicines that they need. Follow these instructions at home:  Take over-the-counter and prescription medicines only as told by your health care provider.  Stay in treatment until you complete the program. Take an active role in your treatment and your physical and emotional self-care. Develop a follow-up plan.  Keep all follow-up visits as told by your health care provider and counselor. This is important.  Eat a healthy diet, exercise regularly, and get enough sleep. Questions to ask your health care provider  If you are taking medicines: ? How long do I need to take medicine? ? Are there any long-term side effects of my medicine? ? Are there other options instead of taking medicine?  Would I benefit from therapy?  How often should I follow up with a health care provider? Contact a health care provider if:  You feel like you might relapse.  You have stopped taking your medicine. Get help right away if:  You have serious thoughts about hurting yourself or others. If you ever feel like you may hurt yourself or others, or have thoughts about taking your own life, get help right away. You can go to your nearest emergency department or call:  Your local emergency services (911 in the [place]  A suicide crisis helpline, such as the National Suicide Prevention Lifeline at 606-318-3932. This is open 24 hours a day. Summary  With treatment and lifestyle changes, it is possible to recover from an addiction to substances like alcohol, tobacco, illegal drugs, or prescription medicines such as painkillers.  Find effective ways to manage your stress to avoid a relapse. Some techniques to cope with stress include exercise, meditation, yoga, and deep breathing.  Let loved ones know that their support is important to help you recover.  If you have any signs that you may relapse, contact your 12-step sponsor, therapist, or health  care provider to seek additional help. This information is not intended to replace advice given to you by your health care provider. Make sure you discuss any questions you have with your health care provider. Document Released: 09/06/2016 Document Revised: 09/06/2016 Document  Reviewed: 09/06/2016 Elsevier Interactive Patient Education  [employer].

## 2018-09-11 ENCOUNTER — Ambulatory Visit: Payer: Self-pay | Admitting: Family Medicine

## 2018-09-14 ENCOUNTER — Other Ambulatory Visit: Payer: Self-pay

## 2018-09-14 ENCOUNTER — Ambulatory Visit (INDEPENDENT_AMBULATORY_CARE_PROVIDER_SITE_OTHER): Payer: BLUE CROSS/BLUE SHIELD | Admitting: Family Medicine

## 2018-09-14 ENCOUNTER — Encounter: Payer: Self-pay | Admitting: Family Medicine

## 2018-09-14 VITALS — Ht 71.0 in | Wt 160.0 lb

## 2018-09-14 DIAGNOSIS — T1490XA Injury, unspecified, initial encounter: Secondary | ICD-10-CM

## 2018-09-14 DIAGNOSIS — K4090 Unilateral inguinal hernia, without obstruction or gangrene, not specified as recurrent: Secondary | ICD-10-CM

## 2018-09-14 DIAGNOSIS — F39 Unspecified mood [affective] disorder: Secondary | ICD-10-CM | POA: Diagnosis not present

## 2018-09-14 DIAGNOSIS — Z9151 Personal history of suicidal behavior: Secondary | ICD-10-CM

## 2018-09-14 DIAGNOSIS — Z915 Personal history of self-harm: Secondary | ICD-10-CM

## 2018-09-14 DIAGNOSIS — F4323 Adjustment disorder with mixed anxiety and depressed mood: Secondary | ICD-10-CM

## 2018-09-14 DIAGNOSIS — Z719 Counseling, unspecified: Secondary | ICD-10-CM

## 2018-09-14 DIAGNOSIS — Z8659 Personal history of other mental and behavioral disorders: Secondary | ICD-10-CM

## 2018-09-14 MED ORDER — ESCITALOPRAM OXALATE 10 MG PO TABS: 10.0000 mg | ORAL_TABLET | Freq: Every day | ORAL | 0 refills | Status: DC

## 2018-09-14 MED ORDER — FLUOXETINE HCL 20 MG PO TABS: 20.0000 mg | ORAL_TABLET | Freq: Every day | ORAL | 0 refills | Status: DC
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
## 2018-12-10 ENCOUNTER — Encounter: Payer: Self-pay | Admitting: Family Medicine

## 2018-12-10 ENCOUNTER — Ambulatory Visit (INDEPENDENT_AMBULATORY_CARE_PROVIDER_SITE_OTHER): Payer: BC Managed Care – PPO | Admitting: Family Medicine

## 2018-12-10 ENCOUNTER — Other Ambulatory Visit: Payer: Self-pay

## 2018-12-10 VITALS — Ht 71.0 in | Wt 155.0 lb

## 2018-12-10 DIAGNOSIS — F4323 Adjustment disorder with mixed anxiety and depressed mood: Secondary | ICD-10-CM

## 2018-12-10 DIAGNOSIS — F39 Unspecified mood [affective] disorder: Secondary | ICD-10-CM | POA: Diagnosis not present

## 2018-12-10 DIAGNOSIS — F1290 Cannabis use, unspecified, uncomplicated: Secondary | ICD-10-CM

## 2018-12-10 DIAGNOSIS — M26609 Unspecified temporomandibular joint disorder, unspecified side: Secondary | ICD-10-CM | POA: Insufficient documentation

## 2018-12-10 DIAGNOSIS — Z719 Counseling, unspecified: Secondary | ICD-10-CM | POA: Diagnosis not present

## 2018-12-10 DIAGNOSIS — Z630 Problems in relationship with spouse or partner: Secondary | ICD-10-CM | POA: Insufficient documentation

## 2018-12-10 DIAGNOSIS — E639 Nutritional deficiency, unspecified: Secondary | ICD-10-CM | POA: Insufficient documentation

## 2018-12-10 MED ORDER — FLUOXETINE HCL 20 MG PO TABS: 20.0000 mg | ORAL_TABLET | Freq: Every day | ORAL | 0 refills | Status: DC

## 2018-12-10 MED ORDER — ESCITALOPRAM OXALATE 10 MG PO TABS: 10.0000 mg | ORAL_TABLET | Freq: Every day | ORAL | 0 refills | Status: DC

## 2018-12-10 NOTE — Progress Notes (Signed)
Virtual / live video office visit note for Philip & McLennanDeborah Pallie Todd, D.O- Primary Care Physician at [facility]   I connected with current patient today and beyond visually recognizing the correct individual, I verified that I am speaking with the correct person using two identifiers.  . Location of the patient: Home . Location of the provider: Office Only the patient (+/- their family members at pt's discretion) and myself were participating in the encounter    - This visit type was conducted due to national recommendations for restrictions regarding the COVID-19 Pandemic (e.g. social distancing) in an effort to limit this patient's exposure and mitigate transmission in our community.  This format is felt to be most appropriate for this patient at this time.   - The patient did have access to video technology today yet, we had technical difficulties with this method, requiring transitioning to audio only.    - No physical exam could be performed with this format, beyond that communicated to us by the patient/ family members as noted.   - Additionally my office staff/ schedulers discussed with the patient that there may be a monetary charge related to this service, depending on patient's medical insurance.   The patient expressed understanding, and agreed to proceed.      History of Present Illness:  In early March, patient was put on Prozac and Lexapro for mixed depression and anxiety type sx.    Patient is driving during e-appointment.   Mood Management Since increasing his Prozac and Lexapro, patient says "I didn't really feel a huge difference."  It's been about three months since last visit.  Says "I don't know where I was at then compared to now."  Comments "I feel really good."  "I kind of like how I feel because nothing bothers me at all."  "I was feeling pretty slumped in the past but now I'm feeling much better [on the meds]."  He has still not obtained a counselor either for  himself individually or for marital counseling.  Says he didn't pursue a marriage counselor at all given his wife's reaction.  Says she comes home and is "wife, mother, boss;" "there's some times where I can't even mess with her."  Regarding obtaining assistance with marital counseling, "it's not like anything is wrong, but we're human; there are days where we get on each other's nerves."  Comments also "I feel like maybe I don't care enough."  Says sometimes he feels zero desire to debate anything with his wife, 'I'm okay with whatever.'"  Says "I'm okay with this but there are times that it bothers me."  Definitely feels his wife carries a lot more weight inside the house, in the daily routine of laundry, dinner, kids.  Says "I'm not a complete slob or bum, but I know that I'm not giving 50%."  Says he has less desire to help at home when he cannot do it on his own time.  He feels that his biggest issue is that he can't get tasks done on his own schedule because "she wants it done when she wants it done."  Says he just wants to get tasks done on his own pace, and that whenever his wife is travelling, "things still get done, but just on my own pace."   Jaw Clenching Says "I have lock jaw all the time; like right now, if I wasn't talking I would be clamping my teeth."  Denies grinding his teeth, stating "just clamping  down real hard."  Feels both sides of his jaw clamping, no pain, denies the area hurting.  Says "I don't seem stressed, I don't feel stressed, I can be watching TV or on my computer working and doing it."  Patient confirms he's been having more headaches than normal, but "thought it was caffeine related."  Per patient, had TMJ as a child.  He does see a dentist regularly.   Lessened Appetite He feels that his appetite is terrible; "I have zero to no appetite almost."  States he has no clue why this is.  Says "I kinda have to force myself to eat."  Notes he rarely eats lunch or  breakfast.  Thinks maybe his appetite has changed since starting his new job.  His "stomach feels hungry, but his brain doesn't want to eat."  Says he's made a routine of skipping breakfast and eating a light lunch and his body is used to it.  Says he feels like he's losing weight; "I feel like I'm getting skinnier."  Says "being underweight, I feel just as self-conscious as other people who are overweight."  He feels determined to develop a routine and see what happens.   Marijuana Use States he continues to occasionally use marijuana, but much less since starting mood meds.     Impression and Recommendations:    1. Mood disorder (HCC)   2. Adjustment disorder with mixed anxiety and depressed mood   3. Health education/counseling   4. Marital stress   5. TMJ (temporomandibular joint syndrome)- no pain, just clamps down   6. Poor eating habits   7. Marijuana use, occassional now ( was continuous prior to mood meds)     Mood Disorder, Marital Stress - Last visit, increased lexapro from 5 to 10 mg.  - Last visit, increased prozac from 10mg  to 20 mg.  - Per patient, feels subjectively more apathetic since increasing doses of medication. - Recommended reducing back to lower doses of meds. - Patient agrees to reduce to half-tablet of each medication.  - Rx will remain at listed dosage but patient will cut tablet in half.  - Re-check in 2-3 months.  - Strongly encouraged patient to obtain psychological counseling. - Strongly advised patient to obtain the assistance individual and marital counseling. - Extensively discussed need to improve communication with his wife. - Recommended that patient assess his role in the household and "pull his equal weight." - All of patient's questions/concerns were addressed.  - Reviewed the "spokes of the wheel" of mood and health management.  Stressed the importance of ongoing prudent habits, including regular exercise, appropriate sleep  hygiene, healthful dietary habits, and prayer/meditation to relax.  -  Will continue to monitor. - Patient knows to call back if needed for further assistance.  Poor Eating Habits & Lessened Appetite - Advised patient to make his eating habits into a routine that he can follow. - Discussed importance of training himself to eat regularly to prevent headaches and weakness. - Extensively discussed importance of adequate nutrition.  - Will continue to monitor.  TMJ - No Pain, Just Clamps Down - Discussed that this is likely a stress reaction and can cause sx such as tooth pain and headaches. - Reviewed importance of resolving this issue to prevent further symptoms. - Per patient, had TMJ as a child.  - Advised patient that he may seek therapy with a counselor to have self-awareness of when he's clenching, as well as exercises to divert his attention. - Patient  may also go to the dentist for evaluation to have a mouth guard fitted.  - Patient agrees to make appointment with dentist.  Health Education & Counseling - Health counseling performed.  All questions answered.  - Advised patient to continue working toward exercising to improve overall mental, physical, and emotional health.    - Healthy dietary habits encouraged, including low-carb, and high amounts of lean protein in diet.   - Patient should also consume adequate amounts of water.   - As part of my medical decision making, I reviewed the following data within the electronic MEDICAL RECORD NUMBER History obtained from pt /family, CMA notes reviewed and incorporated if applicable, Labs reviewed, Radiograph/ tests reviewed if applicable and OV notes from prior OV's with me, as well as other specialists she/he has seen since seeing me last, were all reviewed and used in my medical decision making process today.   - Additionally, discussion had with patient regarding txmnt plan, their biases about that plan etc were used in my medical  decision making today.   - The patient agreed with the plan and demonstrated an understanding of the instructions.   No barriers to understanding were identified.   - Red flag symptoms and signs discussed in detail.  Patient expressed understanding regarding what to do in case of emergency\ urgent symptoms.  The patient was advised to call back or seek an in-person evaluation if the symptoms worsen or if the condition fails to improve as anticipated.   No follow-ups on file.    No orders of the defined types were placed in this encounter.   Meds ordered this encounter  Medications  . escitalopram (LEXAPRO) 10 MG tablet    Sig: Take 1 tablet (10 mg total) by mouth at bedtime.    Dispense:  90 tablet    Refill:  0  . FLUoxetine (PROZAC) 20 MG tablet    Sig: Take 1 tablet (20 mg total) by mouth daily.    Dispense:  90 tablet    Refill:  0    Medications Discontinued During This Encounter  Medication Reason  . escitalopram (LEXAPRO) 10 MG tablet Reorder  . FLUoxetine (PROZAC) 20 MG tablet Reorder     I provided 26 minutes on the phone and 3 minutes of video/ face-to-face time during this encounter,with over 50% of the time in direct counseling on patients medical conditions/ medical concerns.  Additional time was spent with charting and coordination of care after the actual visit commenced.   Note:  This note was prepared with assistance of Dragon voice recognition software. Occasional wrong-word or sound-a-like substitutions may have occurred due to the inherent limitations of voice recognition software.  This document serves as a record of services personally performed by Thomasene Loteborah Bunnie Rehberg, DO. It was created on her behalf by Peggye FothergillKatherine Galloway, a trained medical scribe. The creation of this record is based on the scribe's personal observations and the provider's statements to them.   I have reviewed the above medical documentation for accuracy and completeness and I concur.  Thomasene Loteborah  Lauralye Kinn, DO 12/11/2018 3:50 PM        Patient Care Team    Relationship Specialty Notifications Start End  Thomasene Lotpalski, Saphire Barnhart, DO PCP - General Family Medicine  10/23/16     -Vitals obtained; medications/ allergies reconciled;  personal medical, social, Sx etc.histories were updated by CMA, reviewed by me and are reflected in chart  Patient Active Problem List   Diagnosis Date Noted  . Nicotine dependence  10/23/2016    Priority: High  . Trauma in childhood 10/23/2016    Priority: High  . Marijuana use, continuous 10/23/2016    Priority: Medium  . TMJ (temporomandibular joint syndrome)- no pain, just clamps down 12/10/2018  . Marital stress 12/10/2018  . Poor eating habits 12/10/2018  . Left inguinal hernia 09/14/2018  . Mood disorder ([place]) 07/13/2018  . Adjustment disorder with mixed anxiety and depressed mood 07/13/2018  . Indirect left inguinal hernia 01/01/2018  . Breast mass, left 01/01/2018  . Screening for multiple conditions 10/23/2016  . Health education/counseling 10/23/2016  . History of posttraumatic stress disorder (PTSD)- as teen 10/23/2016  . H/O: attempted suicide as teen 10/23/2016     Current Meds  Medication Sig  . escitalopram (LEXAPRO) 10 MG tablet Take 1 tablet (10 mg total) by mouth at bedtime.  Marland Kitchen FLUoxetine (PROZAC) 20 MG tablet Take 1 tablet (20 mg total) by mouth daily.  . [DISCONTINUED] escitalopram (LEXAPRO) 10 MG tablet Take 1 tablet (10 mg total) by mouth at bedtime.  . [DISCONTINUED] FLUoxetine (PROZAC) 20 MG tablet Take 1 tablet (20 mg total) by mouth daily.     Allergies  Allergen Reactions  . Penicillins      ROS:  See above HPI for pertinent positives and negatives   Objective:   Height 5\' 11"  (1.803 m), weight 155 lb (70.3 kg).  (if some vitals are omitted, this means that patient was UNABLE to obtain them even though they were asked to get them prior to OV today.  They were asked to call us at their earliest convenience with  these once obtained.)  General: A & O * 3; visually in no acute distress; in usual state of health.  Skin: Visible skin appears normal and pt's usual skin color HEENT:  EOMI, head is normocephalic and atraumatic.  Sclera are anicteric. Neck has a good range of motion.  Lips are noncyanotic Chest: normal chest excursion and movement Respiratory: speaking in full sentences, no conversational dyspnea; no use of accessory muscles Psych: insight good, mood- appears full

## 2019-05-06 ENCOUNTER — Ambulatory Visit: Payer: BC Managed Care – PPO | Attending: Internal Medicine

## 2019-05-06 DIAGNOSIS — Z20822 Contact with and (suspected) exposure to covid-19: Secondary | ICD-10-CM

## 2019-05-08 ENCOUNTER — Telehealth: Payer: Self-pay

## 2019-05-08 LAB — NOVEL CORONAVIRUS, NAA: SARS-CoV-2, NAA: NOT DETECTED

## 2019-05-08 NOTE — Telephone Encounter (Signed)
Pt called for covid results advised that results are not back 

## 2019-05-08 NOTE — Telephone Encounter (Signed)
Pt called for covid results advised results are not back. Discussed MyChart enrollment.

## 2019-12-18 IMAGING — US ULTRASOUND LEFT BREAST LIMITED
1 series · 11 of 11 positions shown · non-contrast
Comparison: None

ACR Breast Density Category a: The breast tissue is almost entirely
fatty.

CLINICAL DATA: 32-year-old male with palpable lump in the OUTER
LEFT breast discovered on self-examination.

EXAM:
DIGITAL DIAGNOSTIC BILATERAL MAMMOGRAM WITH CAD AND TOMO
ULTRASOUND LEFT BREAST

[Series 1: ultrasound left breast limited · 0.05mm/px · 11 of 11 slices shown]
[im 1/11]
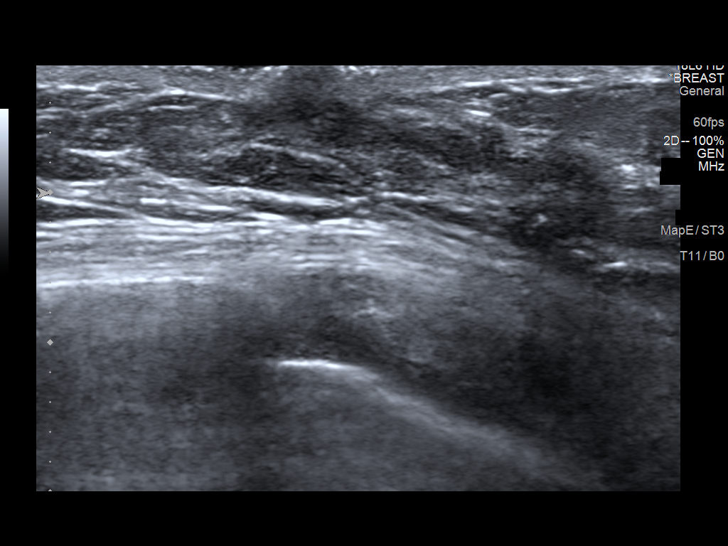
[im 2/11]
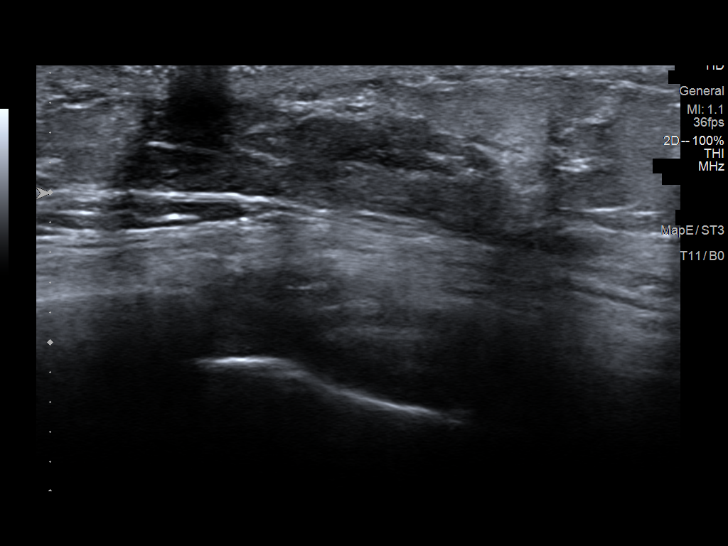
[im 3/11]
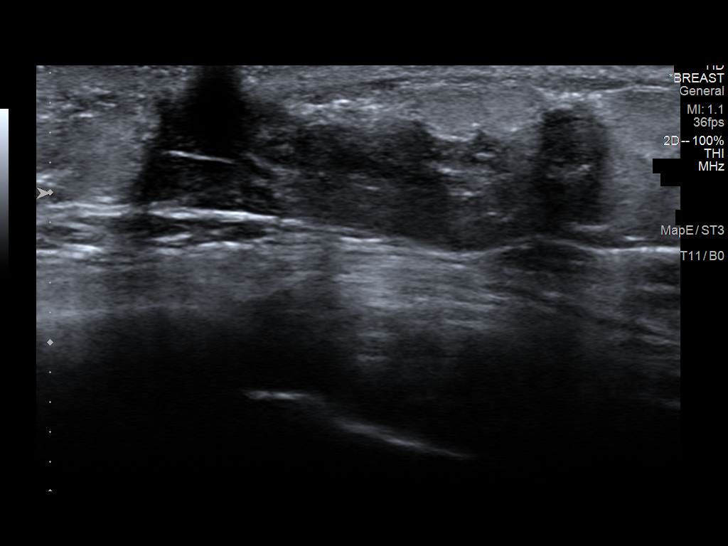
[im 4/11]
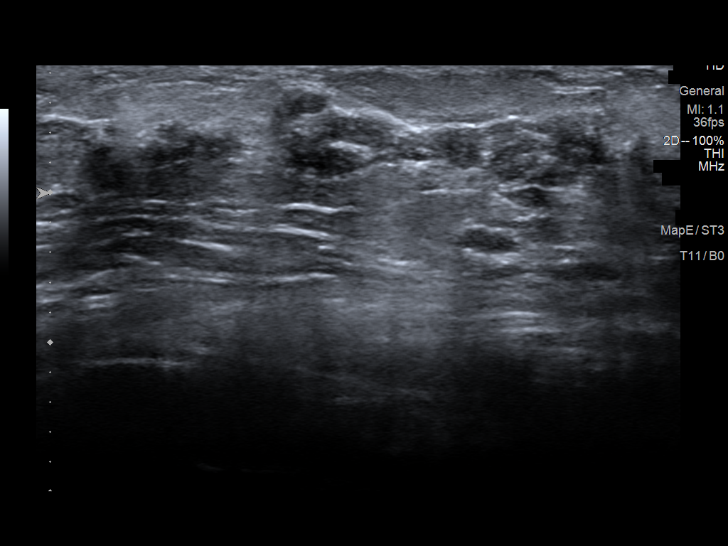
[im 5/11]
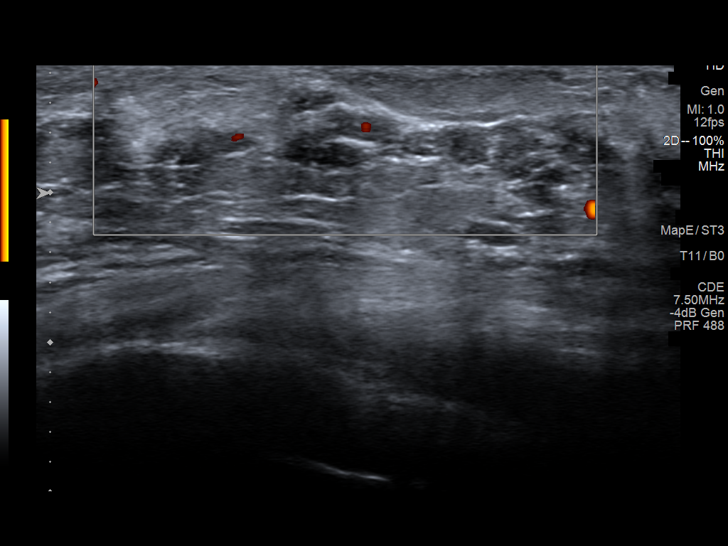
[im 6/11]
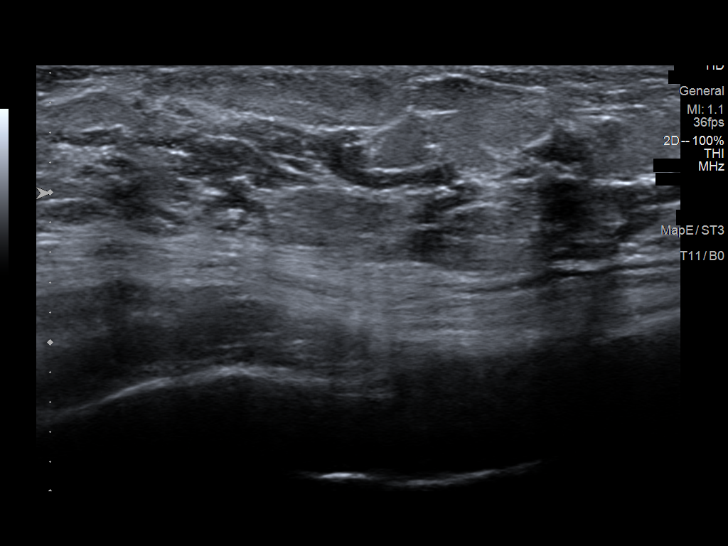
[im 7/11]
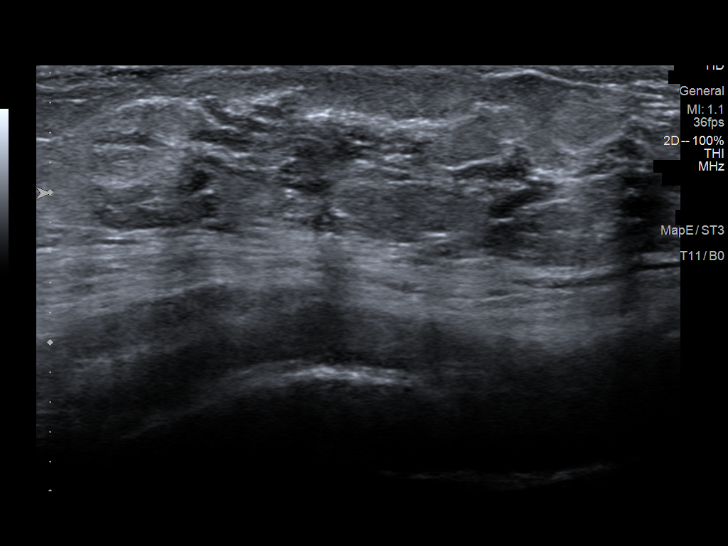
[im 8/11]
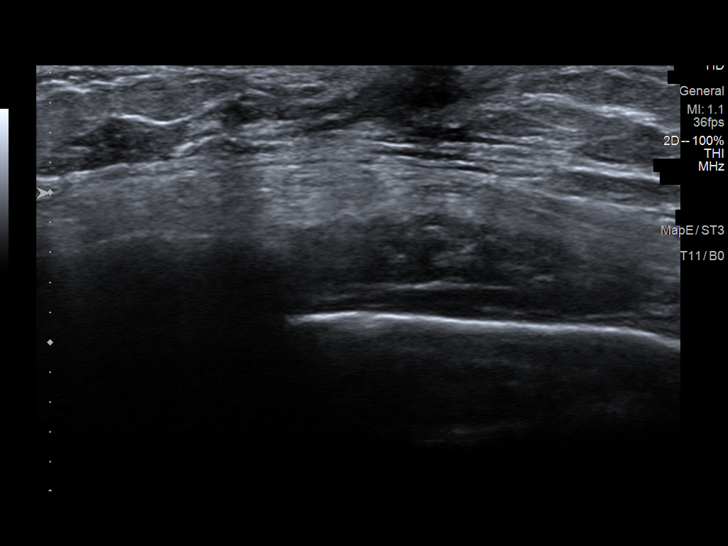
[im 9/11]
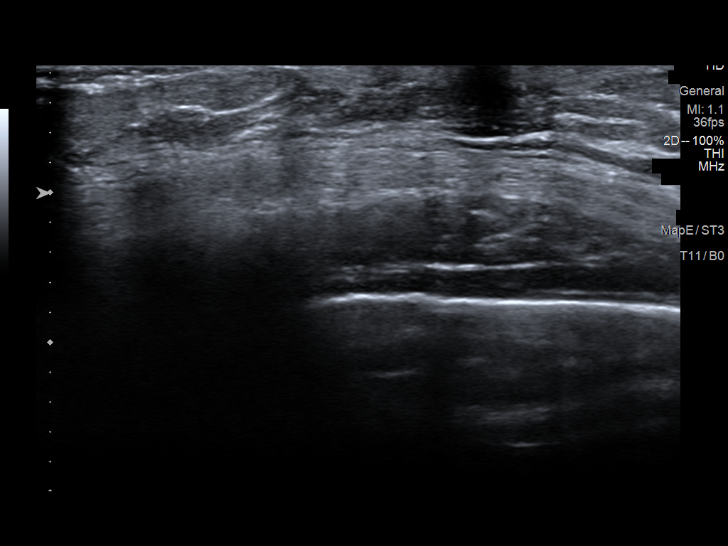
[im 10/11]
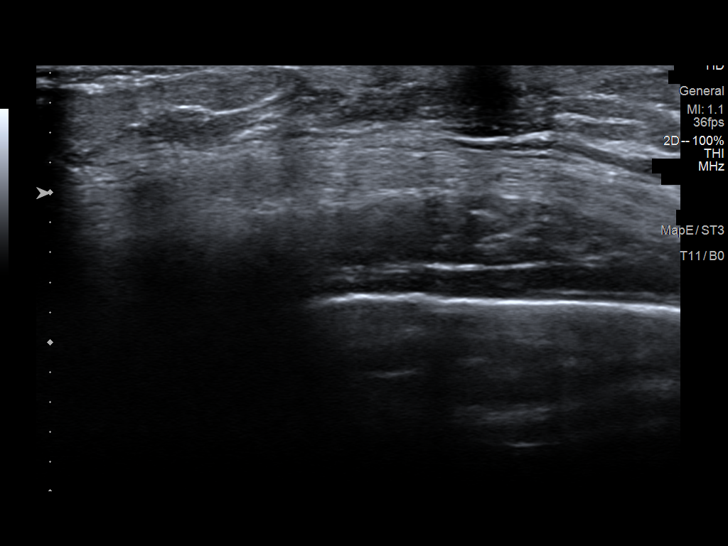
[im 11/11]
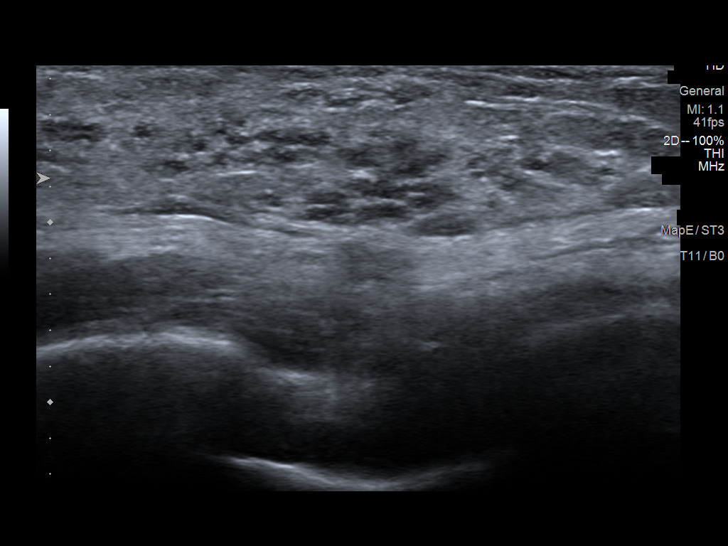

[11 of 11 positions shown; findings below may reference images not displayed]

FINDINGS: 2D/3D full field views of both breasts and a spot compression view
of the LEFT breast demonstrate bilateral gynecomastia, mild on the
RIGHT and moderate to large amount on the LEFT.

No suspicious mass, distortion or worrisome calcifications
identified.

Mammographic images were processed with CAD.

On physical exam, soft palpable thickening within the OUTER LEFT
breast identified without discrete mass.

Targeted ultrasound is performed, showing a moderate to large amount
of normal fibroglandular tissue within the OUTER LEFT breast,
compatible with gynecomastia.
IMPRESSION: 1. Bilateral gynecomastia, mild on the RIGHT and moderate to large
amount on the LEFT.
2. No evidence of breast malignancy.

RECOMMENDATION:
Clinical follow-up as indicated.  No imaging follow-up recommended.

I have discussed the findings, causes of gynecomastia and
recommendations with the patient. Results were also provided in
writing at the conclusion of the visit. If applicable, a reminder
letter will be sent to the patient regarding the next appointment.

BI-RADS CATEGORY  2: Benign.

## 2019-12-18 IMAGING — MG DIGITAL DIAGNOSTIC BILATERAL MAMMOGRAM WITH TOMO AND CAD
6 of 10 series · 6 of 30 positions shown · non-contrast
Comparison: None

ACR Breast Density Category a: The breast tissue is almost entirely
fatty.

CLINICAL DATA: 32-year-old male with palpable lump in the OUTER
LEFT breast discovered on self-examination.

EXAM:
DIGITAL DIAGNOSTIC BILATERAL MAMMOGRAM WITH CAD AND TOMO
ULTRASOUND LEFT BREAST

[L MLO synth-2D]
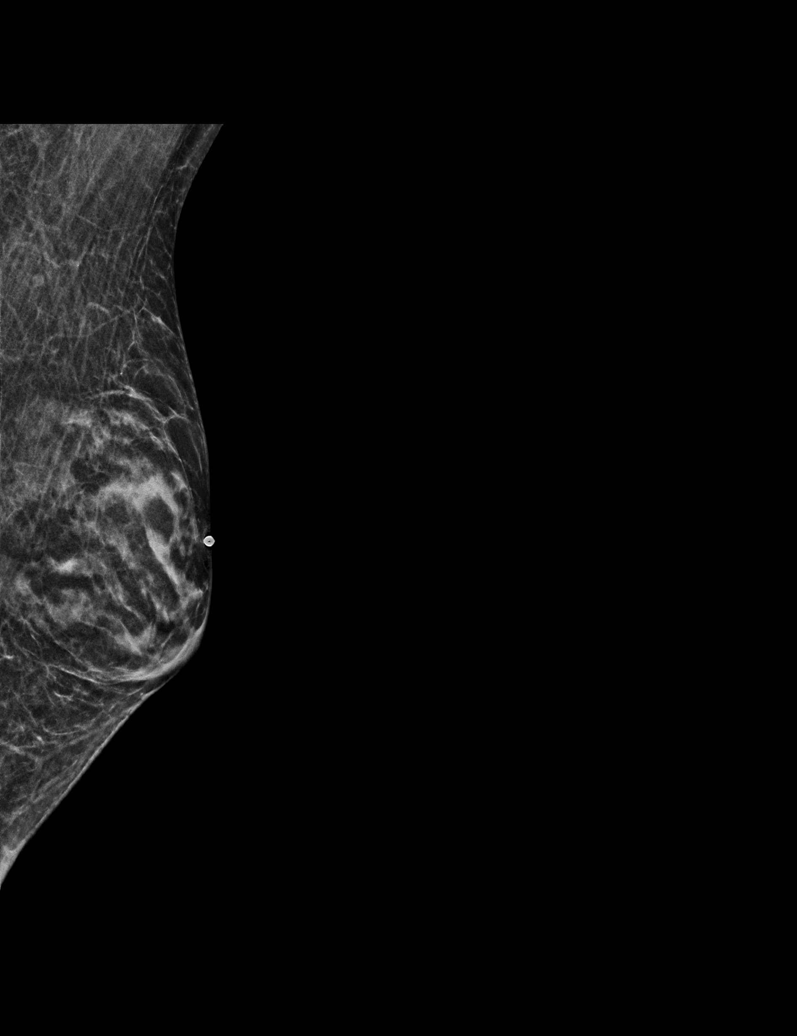

[L CC synth-2D]
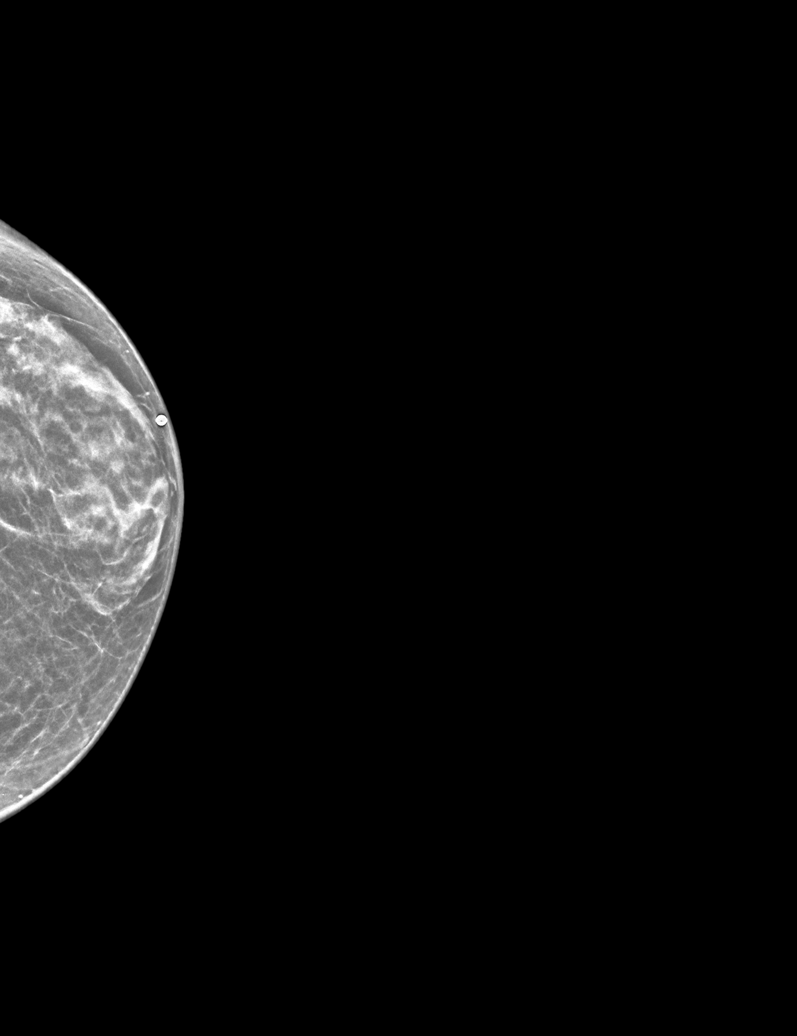

[R MLO synth-2D]
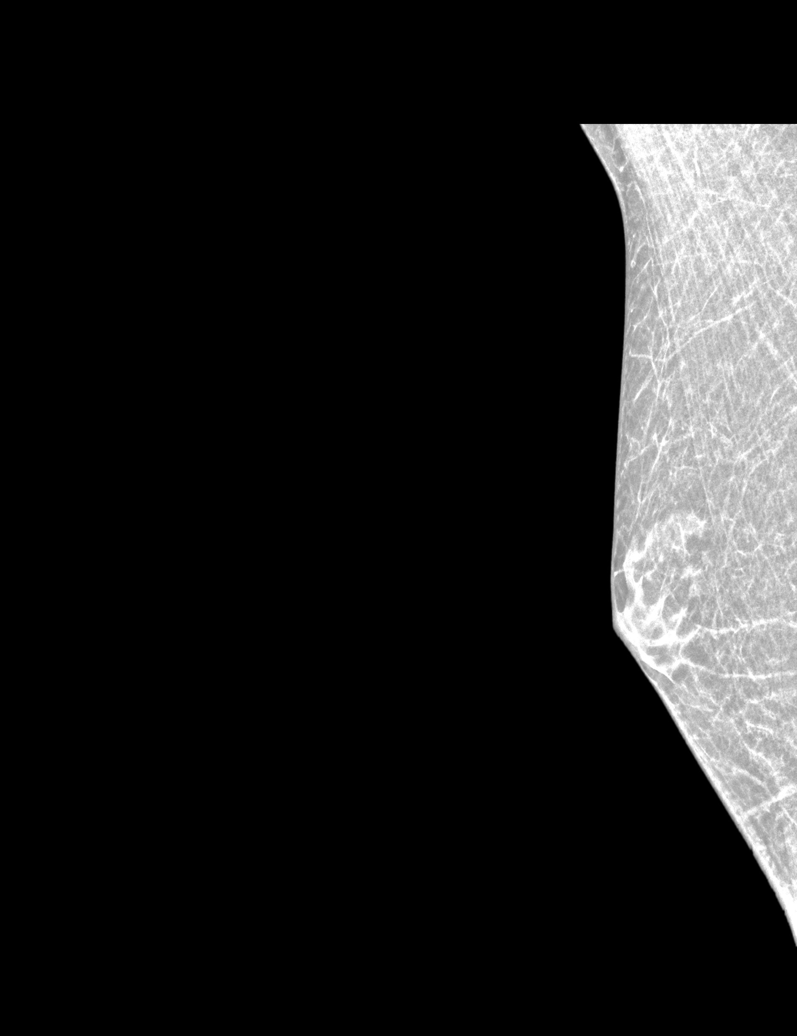

[R CC synth-2D]
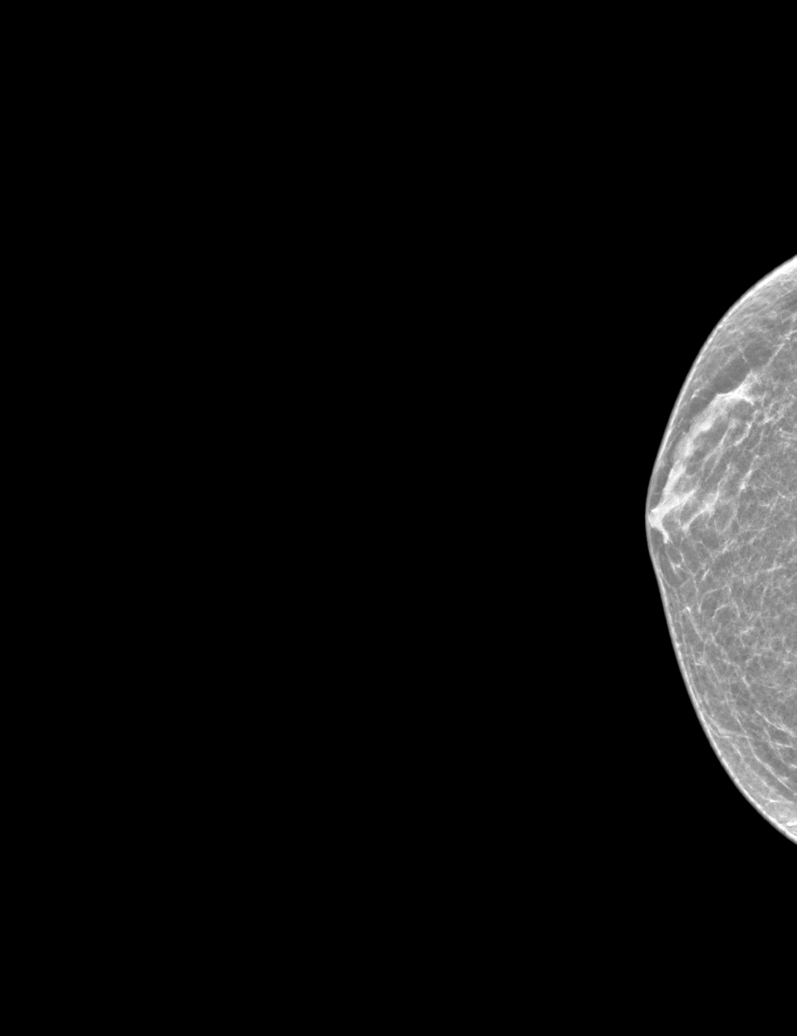

[L TAN synth-2D]
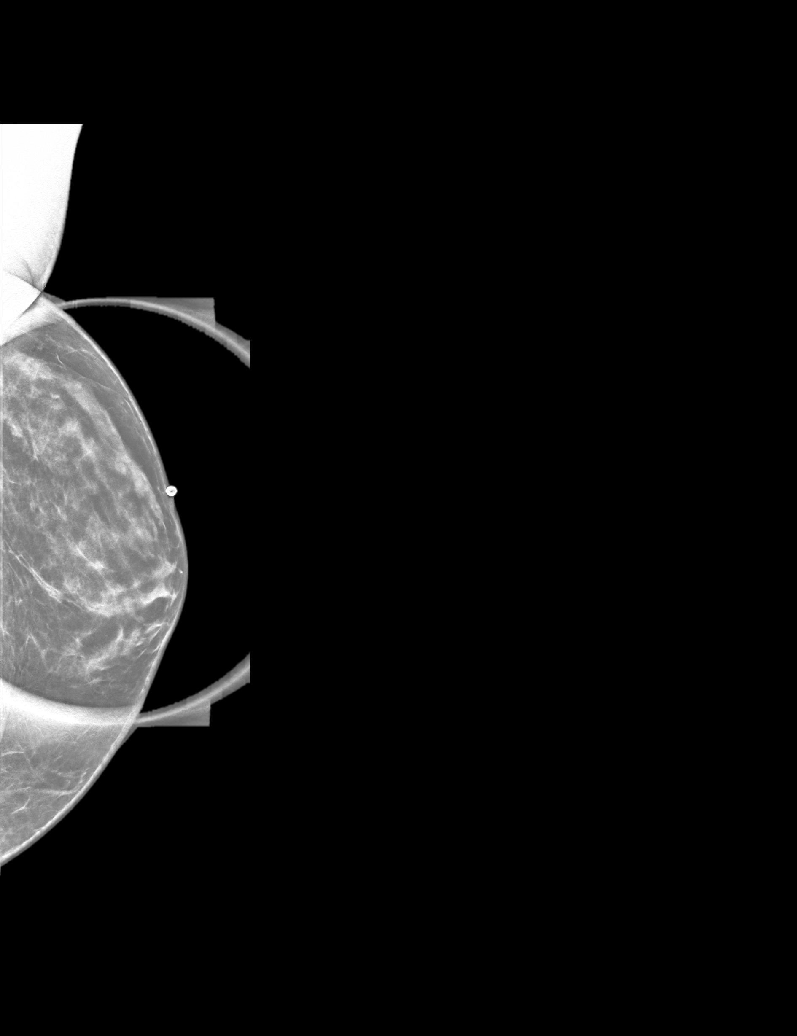

[L CC tomo · tomo slice 19/38.0]
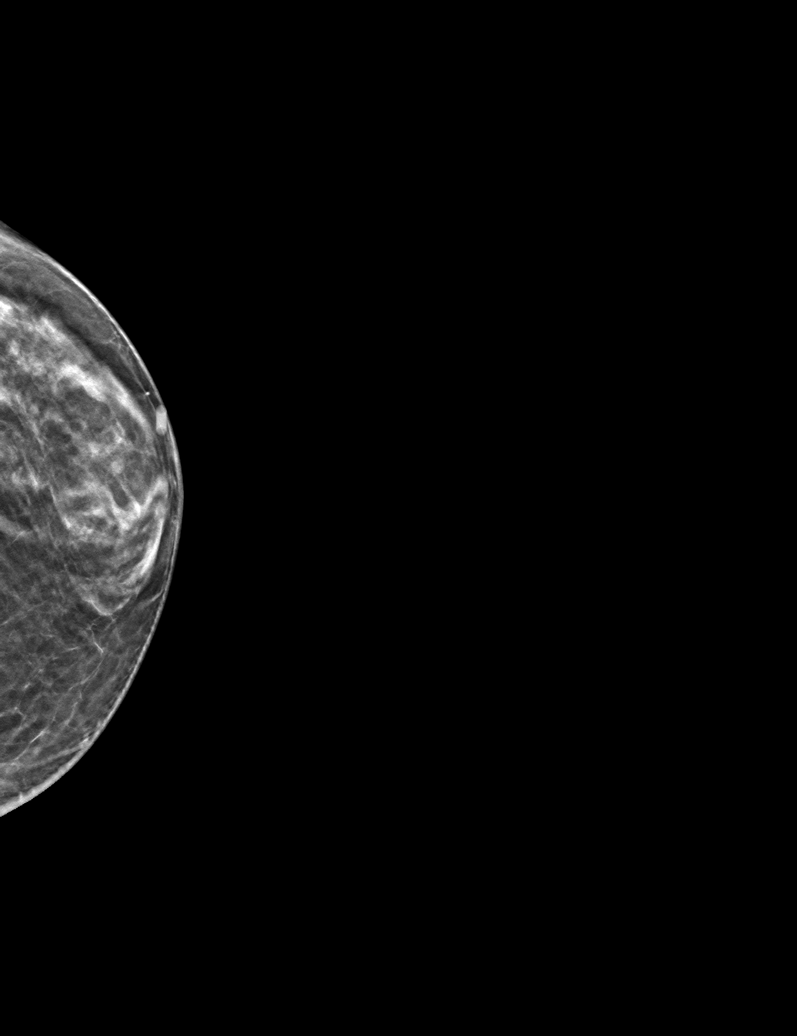

[6 of 30 positions shown; findings below may reference images not displayed]

FINDINGS: 2D/3D full field views of both breasts and a spot compression view
of the LEFT breast demonstrate bilateral gynecomastia, mild on the
RIGHT and moderate to large amount on the LEFT.

No suspicious mass, distortion or worrisome calcifications
identified.

Mammographic images were processed with CAD.

On physical exam, soft palpable thickening within the OUTER LEFT
breast identified without discrete mass.

Targeted ultrasound is performed, showing a moderate to large amount
of normal fibroglandular tissue within the OUTER LEFT breast,
compatible with gynecomastia.
IMPRESSION: 1. Bilateral gynecomastia, mild on the RIGHT and moderate to large
amount on the LEFT.
2. No evidence of breast malignancy.

RECOMMENDATION:
Clinical follow-up as indicated.  No imaging follow-up recommended.

I have discussed the findings, causes of gynecomastia and
recommendations with the patient. Results were also provided in
writing at the conclusion of the visit. If applicable, a reminder
letter will be sent to the patient regarding the next appointment.

BI-RADS CATEGORY  2: Benign.

## 2020-02-14 ENCOUNTER — Other Ambulatory Visit: Payer: Self-pay

## 2020-02-14 ENCOUNTER — Ambulatory Visit
Admission: EM | Admit: 2020-02-14 | Discharge: 2020-02-14 | Disposition: A | Discharge status: Home / Self Care | Payer: Self-pay | Attending: Emergency Medicine | Admitting: Emergency Medicine

## 2020-02-14 ENCOUNTER — Encounter: Payer: Self-pay | Admitting: *Deleted

## 2020-02-14 DIAGNOSIS — R509 Fever, unspecified: Secondary | ICD-10-CM

## 2020-02-14 DIAGNOSIS — R112 Nausea with vomiting, unspecified: Secondary | ICD-10-CM

## 2020-02-14 MED ORDER — ONDANSETRON 4 MG PO TBDP: 4.0000 mg | ORAL_TABLET | Freq: Three times a day (TID) | ORAL | 0 refills | Status: DC | PRN

## 2020-02-14 NOTE — ED Triage Notes (Signed)
Pt reports starting on SAt he started to have Nausea ,vomiting , fever and diarrhea. Pt had been on a out of town fishing trip with friends. Pt unsure if other friends are sick.

## 2020-02-14 NOTE — ED Provider Notes (Signed)
[facility]    CSN: [id number] Arrival date & time: 02/14/20  1115      History   Chief Complaint Chief Complaint  Patient presents with  . Fever  . Diarrhea    HPI Philip Todd is a 34 y.o. male presenting today for evaluation of nausea vomiting diarrhea and fevers.  Patient recently returned from a fishing trip and began to develop nausea vomiting and diarrhea since Saturday.  Has also had associated fevers up to 101 with associated night sweats chills and fatigue.  Denies significant URI symptoms.  Does report a mild cough.  Denies any specific close exposures to Covid, but may have had a few cases at work.  Use Tylenol and DayQuil today and feels slightly improved.  HPI  History reviewed. No pertinent past medical history.  Patient Active Problem List   Diagnosis Date Noted  . TMJ (temporomandibular joint syndrome)- no pain, just clamps down 12/10/2018  . Marital stress 12/10/2018  . Poor eating habits 12/10/2018  . Left inguinal hernia 09/14/2018  . Mood disorder (HCC) 07/13/2018  . Adjustment disorder with mixed anxiety and depressed mood 07/13/2018  . Indirect left inguinal hernia 01/01/2018  . Breast mass, left 01/01/2018  . Screening for multiple conditions 10/23/2016  . Health education/counseling 10/23/2016  . Nicotine dependence 10/23/2016  . Marijuana use, continuous 10/23/2016  . Trauma in childhood 10/23/2016  . History of posttraumatic stress disorder (PTSD)- as teen 10/23/2016  . H/O: attempted suicide as teen 10/23/2016    History reviewed. No pertinent surgical history.     Home Medications    Prior to Admission medications   Medication Sig Start Date End Date Taking? Authorizing Provider  ondansetron (ZOFRAN ODT) 4 MG disintegrating tablet Take 1-2 tablets (4-8 mg total) by mouth every 8 (eight) hours as needed for nausea or vomiting. 02/14/20   Aracelys Glade C, PA-C  escitalopram (LEXAPRO) 10 MG tablet Take 1 tablet (10 mg  total) by mouth at bedtime. 12/10/18 02/14/20  Opalski, Deborah, DO  FLUoxetine (PROZAC) 20 MG tablet Take 1 tablet (20 mg total) by mouth daily. 12/10/18 02/14/20  Thomasene Lot, DO    Family History Family History  Problem Relation Age of Onset  . Diabetes Mother     Social History Social History   Tobacco Use  . Smoking status: Former Smoker    Packs/day: 1.00    Types: Cigarettes    Quit date: 09/2017    Years since quitting: 2.4  . Smokeless tobacco: Never Used  Vaping Use  . Vaping Use: Every day  Substance Use Topics  . Alcohol use: Yes    Comment: occ.   . Drug use: No     Allergies   Penicillins   Review of Systems Review of Systems  Constitutional: Positive for fever. Negative for activity change, appetite change, chills and fatigue.  HENT: Negative for congestion, ear pain, rhinorrhea, sinus pressure, sore throat and trouble swallowing.   Eyes: Negative for discharge and redness.  Respiratory: Negative for cough, chest tightness and shortness of breath.   Cardiovascular: Negative for chest pain.  Gastrointestinal: Positive for diarrhea, nausea and vomiting. Negative for abdominal pain.  Musculoskeletal: Negative for myalgias.  Skin: Negative for rash.  Neurological: Negative for dizziness, light-headedness and headaches.     Physical Exam Triage Vital Signs ED Triage Vitals  Enc Vitals Group     BP 02/14/20 1211 129/84     Pulse Rate 02/14/20 1211 98     Resp  02/14/20 1211 18     Temp 02/14/20 1211 98.5 F (36.9 C)     Temp Source 02/14/20 1211 Oral     SpO2 02/14/20 1211 97 %     Weight 02/14/20 1213 150 lb (68 kg)     Height 02/14/20 1213 5\' 11"  (1.803 m)     Head Circumference --      Peak Flow --      Pain Score 02/14/20 1212 0     Pain Loc --      Pain Edu? --      Excl. in GC? --    No data found.  Updated Vital Signs BP 129/84 (BP Location: Left Arm)   Pulse 98   Temp 98.5 F (36.9 C) (Oral)   Resp 18   Ht 5\' 11"  (1.803 m)    Wt 150 lb (68 kg)   SpO2 97%   BMI 20.92 kg/m   Visual Acuity Right Eye Distance:   Left Eye Distance:   Bilateral Distance:    Right Eye Near:   Left Eye Near:    Bilateral Near:     Physical Exam Vitals and nursing note reviewed.  Constitutional:      Appearance: He is well-developed.     Comments: No acute distress  HENT:     Head: Normocephalic and atraumatic.     Ears:     Comments: Bilateral ears without tenderness to palpation of external auricle, tragus and mastoid, EAC's without erythema or swelling, TM's with good bony landmarks and cone of light. Non erythematous.     Nose: Nose normal.     Mouth/Throat:     Comments: Oral mucosa pink and moist, no tonsillar enlargement or exudate. Posterior pharynx patent and nonerythematous, no uvula deviation or swelling. Normal phonation. Eyes:     Conjunctiva/sclera: Conjunctivae normal.  Cardiovascular:     Rate and Rhythm: Normal rate.  Pulmonary:     Effort: Pulmonary effort is normal. No respiratory distress.     Comments: Breathing comfortably at rest, CTABL, no wheezing, rales or other adventitious sounds auscultated Abdominal:     General: There is no distension.     Tenderness: There is no abdominal tenderness.  Musculoskeletal:        General: Normal range of motion.     Cervical back: Neck supple.  Skin:    General: Skin is warm and dry.  Neurological:     Mental Status: He is alert and oriented to person, place, and time.      UC Treatments / Results  Labs (all labs ordered are listed, but only abnormal results are displayed) Labs Reviewed  NOVEL CORONAVIRUS, NAA    EKG   Radiology No results found.  Procedures Procedures (including critical care time)  Medications Ordered in UC Medications - No data to display  Initial Impression / Assessment and Plan / UC Course  I have reviewed the triage vital signs and the nursing notes.  Pertinent labs & imaging results that were available during  my care of the patient were reviewed by me and considered in my medical decision making (see chart for details).     Covid test pending, suspect likely viral etiology of viral gastroenteritis versus other viral cause.  Recommending symptomatic and supportive care.  No abdominal tenderness, do not suspect abdominal emergency.  Zofran as needed for nausea, focus on fluids and oral rehydration.  Discussed strict return precautions. Patient verbalized understanding and is agreeable with plan.  Final Clinical  Impressions(s) / UC Diagnoses   Final diagnoses:  Non-intractable vomiting with nausea, unspecified vomiting type  Fever, unspecified     Discharge Instructions     COVID test pending Zofran as needed for nausea Rest and fluids Follow up as needed    ED Prescriptions    Medication Sig Dispense Auth. Provider   ondansetron (ZOFRAN ODT) 4 MG disintegrating tablet Take 1-2 tablets (4-8 mg total) by mouth every 8 (eight) hours as needed for nausea or vomiting. 20 tablet Charlis Harner, [employer] C, PA-C     PDMP not reviewed this encounter.   Lew Dawes, PA-C 02/14/20 1243

## 2020-02-14 NOTE — Discharge Instructions (Signed)
COVID test pending Zofran as needed for nausea Rest and fluids Follow up as needed

## 2020-02-14 NOTE — ED Notes (Signed)
PT took Tylenol before coming for appt.

## 2020-02-15 LAB — SARS-COV-2, NAA 2 DAY TAT

## 2020-02-15 LAB — NOVEL CORONAVIRUS, NAA: SARS-CoV-2, NAA: NOT DETECTED

## 2022-08-07 ENCOUNTER — Ambulatory Visit (INDEPENDENT_AMBULATORY_CARE_PROVIDER_SITE_OTHER): Payer: Medicaid Other | Admitting: Family Medicine

## 2022-08-07 ENCOUNTER — Encounter: Payer: Self-pay | Admitting: Family Medicine

## 2022-08-07 VITALS — BP 121/81 | HR 76 | Resp 20 | Ht 71.0 in | Wt 184.0 lb

## 2022-08-07 DIAGNOSIS — M25612 Stiffness of left shoulder, not elsewhere classified: Secondary | ICD-10-CM

## 2022-08-07 DIAGNOSIS — Z136 Encounter for screening for cardiovascular disorders: Secondary | ICD-10-CM

## 2022-08-07 DIAGNOSIS — M25611 Stiffness of right shoulder, not elsewhere classified: Secondary | ICD-10-CM

## 2022-08-07 DIAGNOSIS — Z1329 Encounter for screening for other suspected endocrine disorder: Secondary | ICD-10-CM

## 2022-08-07 DIAGNOSIS — M4004 Postural kyphosis, thoracic region: Secondary | ICD-10-CM

## 2022-08-07 DIAGNOSIS — J3089 Other allergic rhinitis: Secondary | ICD-10-CM

## 2022-08-07 DIAGNOSIS — Z130 Encounter for screening for diseases of the blood and blood-forming organs and certain disorders involving the immune mechanism: Secondary | ICD-10-CM

## 2022-08-07 DIAGNOSIS — Z1321 Encounter for screening for nutritional disorder: Secondary | ICD-10-CM

## 2022-08-07 DIAGNOSIS — Z13228 Encounter for screening for other metabolic disorders: Secondary | ICD-10-CM
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
## 2022-08-07 NOTE — Patient Instructions (Signed)
You should not need a referral to get into a chiropractor, but please let me know if you have any trouble getting into one.  Before your next appointment, find out if either of your parents have any history of cancerous colon polyps or adenomas.  Also find out how many colon polyps they had at a time.

## 2022-08-08 ENCOUNTER — Other Ambulatory Visit: Payer: Self-pay | Admitting: Family Medicine

## 2022-08-08 DIAGNOSIS — E559 Vitamin D deficiency, unspecified: Secondary | ICD-10-CM | POA: Insufficient documentation

## 2022-08-08 LAB — CBC WITH DIFFERENTIAL/PLATELET
Basophils Absolute: 0 10*3/uL (ref 0.0–0.2)
Basos: 1 %
EOS (ABSOLUTE): 0.1 10*3/uL (ref 0.0–0.4)
Eos: 2 %
Hematocrit: 41.1 % (ref 37.5–51.0)
Hemoglobin: 13.9 g/dL (ref 13.0–17.7)
Immature Grans (Abs): 0 10*3/uL (ref 0.0–0.1)
Immature Granulocytes: 0 %
Lymphocytes Absolute: 1.8 10*3/uL (ref 0.7–3.1)
Lymphs: 30 %
MCH: 30 pg (ref 26.6–33.0)
MCHC: 33.8 g/dL (ref 31.5–35.7)
MCV: 89 fL (ref 79–97)
Monocytes Absolute: 0.5 10*3/uL (ref 0.1–0.9)
Monocytes: 8 %
Neutrophils Absolute: 3.5 10*3/uL (ref 1.4–7.0)
Neutrophils: 59 %
Platelets: 295 10*3/uL (ref 150–450)
RBC: 4.64 x10E6/uL (ref 4.14–5.80)
RDW: 12.9 % (ref 11.6–15.4)
WBC: 5.9 10*3/uL (ref 3.4–10.8)

## 2022-08-08 LAB — HEMOGLOBIN A1C
Est. average glucose Bld gHb Est-mCnc: 114 mg/dL
Hgb A1c MFr Bld: 5.6 % (ref 4.8–5.6)

## 2022-08-08 LAB — COMPREHENSIVE METABOLIC PANEL
ALT: 27 IU/L (ref 0–44)
AST: 30 IU/L (ref 0–40)
Albumin/Globulin Ratio: 1.9 (ref 1.2–2.2)
Albumin: 4.4 g/dL (ref 4.1–5.1)
Alkaline Phosphatase: 67 IU/L (ref 44–121)
BUN/Creatinine Ratio: 23 — ABNORMAL HIGH (ref 9–20)
BUN: 21 mg/dL — ABNORMAL HIGH (ref 6–20)
Bilirubin Total: 0.4 mg/dL (ref 0.0–1.2)
CO2: 22 mmol/L (ref 20–29)
Calcium: 9.3 mg/dL (ref 8.7–10.2)
Chloride: 104 mmol/L (ref 96–106)
Creatinine, Ser: 0.92 mg/dL (ref 0.76–1.27)
Globulin, Total: 2.3 g/dL (ref 1.5–4.5)
Glucose: 101 mg/dL — ABNORMAL HIGH (ref 70–99)
Potassium: 4.6 mmol/L (ref 3.5–5.2)
Sodium: 140 mmol/L (ref 134–144)
Total Protein: 6.7 g/dL (ref 6.0–8.5)
eGFR: 111 mL/min/{1.73_m2} (ref >59)

## 2022-08-08 LAB — VITAMIN D 25 HYDROXY (VIT D DEFICIENCY, FRACTURES): Vit D, 25-Hydroxy: 21.7 ng/mL — ABNORMAL LOW (ref 30.0–100.0)

## 2022-08-08 LAB — LIPID PANEL
Chol/HDL Ratio: 2.8 ratio (ref 0.0–5.0)
Cholesterol, Total: 179 mg/dL (ref 100–199)
HDL: 65 mg/dL (ref >39)
LDL Chol Calc (NIH): 103 mg/dL — ABNORMAL HIGH (ref 0–99)
Triglycerides: 55 mg/dL (ref 0–149)
VLDL Cholesterol Cal: 11 mg/dL (ref 5–40)

## 2022-08-08 LAB — TSH: TSH: 2.15 u[IU]/mL (ref 0.450–4.500)

## 2022-08-08 MED ORDER — VITAMIN D (ERGOCALCIFEROL) 1.25 MG (50000 UNIT) PO CAPS: 50000.0000 [IU] | ORAL_CAPSULE | ORAL | 0 refills | Status: DC

## 2022-08-28 NOTE — Progress Notes (Unsigned)
New Patient Note  RE: Philip Todd MRN: [id number] DOB: 22-Oct-1985 Date of Office Visit: 08/29/2022  Consult requested by: [name], PA Primary care provider: [name], PA  Chief Complaint: Rash (Had a rash on his stomach that was on his stomach about a month ago- itchy ) and Allergic Rhinitis   History of Present Illness: I had the pleasure of seeing Philip Todd for initial evaluation at the [facility] on 08/29/2022. He is a 37 y.o. male, who is referred here by [name], PA for the evaluation of rash and environmental allergies.  Rash started about 1 month ago. This was localized to his left abdominal area. Describes them as itchy and not sure about insect/bug bite. Individual rashes lasts about 2 weeks. No ecchymosis upon resolution. Associated symptoms include: none.  Suspected triggers are unknown. Denies any fevers, chills, changes in medications, foods, personal care products or recent infections. He has tried the following therapies: hydrocortisone with minimal benefit. Previous work up includes: none. Previous history of rash/hives: no. Patient is up to date with the following cancer screening tests: physical exam.  He reports symptoms of rhinorrhea, nasal congestion, coughing, PND, sneezing, itchy/watery eyes. Symptoms have been going on for many years. The symptoms are present mainly in the spring and fall. Anosmia: no. Headache: sometimes. He has used zyrtec with minimal improvement in symptoms. Tried afrin prn with some benefit.  Sinus infections: no. Previous work up includes: none. Previous ENT evaluation: as a child maybe - no sinus surgery. Previous sinus imaging: no. History of nasal polyps: no. Last eye exam: 5 years ago. History of reflux: occasionally, improved since he quit smoking.  Assessment and Plan: Teddie is a 37 y.o. male with: Other allergic rhinitis Rhinoconjunctivitis symptoms mainly in the spring and  fall.  Tried Zyrtec with minimal benefit.  No prior allergy evaluation.  Likes being outdoors. Today's skin testing showed: Positive to grass, ragweed, weed, trees, mold, cat, dog. Start environmental control measures as below. Take Xyzal (levocetirizine) daily as needed. May take twice a day during allergy flares. May switch antihistamines every few months. Start Singulair (montelukast)  daily at night. Cautioned that in some children/adults can experience behavioral changes including hyperactivity, agitation, depression, sleep disturbances and suicidal ideations. These side effects are rare, but if you notice them you should notify me and discontinue Singulair (montelukast). Use Flonase (fluticasone) nasal spray 1 spray per nostril twice a day as needed for nasal congestion.  Use azelastine nasal spray 1-2 sprays per nostril twice a day as needed for runny nose/drainage. Nasal saline spray (i.e., Simply Saline) or nasal saline lavage (i.e., NeilMed) is recommended as needed and prior to medicated nasal sprays. Use olopatadine eye drops 0.2% once a day as needed for itchy/watery eyes. Sample given.  Consider allergy injections for long term control if above medications do not help the symptoms - handout given.   Allergic conjunctivitis of both eyes See assessment and plan as above.  Rash and other nonspecific skin eruption Rash outbreak 1 month ago lasting 2 weeks on abdominal area. No triggers noted.  Etiology unclear - concerning for insect/bug bite or contact irritation. If this happens again, take picture and let us know.  Chronic cough Ex-smoker. Coughing at times. Reflux improved after quit smoking. Today's skin testing showed: Positive to grass, ragweed, weed, trees, mold, cat, dog. Negative to common foods.  Today's spirometry showed: normal pattern with 1% improvement in FEV1 post bronchodilator treatment.  Clinically feeling unchanged.  Monitor symptoms. Based on clinical  history and testing results, most likely from PND.  See plan as above for allergic rhinitis.   Gastroesophageal reflux disease See handout for lifestyle and dietary modifications.  Penicillin allergy Patient unsure of exact reaction. Advised patient to ask his parents about this penicillin allergy.  Consider penicillin allergy skin testing and in office drug challenge in the future.  Over 90% of people with history of penicillin allergy which occurred over 10 years ago are found to be non-allergic.   Return in about 2 months (around 10/29/2022).  Meds ordered this encounter  Medications   montelukast (SINGULAIR) 10 MG tablet    Sig: Take 1 tablet (10 mg total) by mouth at bedtime.    Dispense:  30 tablet    Refill:  5   azelastine (ASTELIN) 0.1 % nasal spray    Sig: Place 1-2 sprays into both nostrils 2 (two) times daily as needed (nasal drainage). Use in each nostril as directed    Dispense:  30 mL    Refill:  5   fluticasone (FLONASE) 50 MCG/ACT nasal spray    Sig: Place 1 spray into both nostrils 2 (two) times daily as needed (nasal congestion).    Dispense:  16 g    Refill:  5   Olopatadine HCl 0.2 % SOLN    Sig: Apply 1 drop to eye daily as needed.    Dispense:  2.5 mL    Refill:  5   levocetirizine (XYZAL) 5 MG tablet    Sig: Take 1 tablet (5 mg total) by mouth every evening.    Dispense:  30 tablet    Refill:  5   Lab Orders  No laboratory test(s) ordered today    Other allergy screening: Asthma:  patient had an inhaler in the past many years ago and not sure why he had it. Food allergy: no Medication allergy:  patient is not sure what happens when he takes penicillin. He was told not to take it since a young child. Hymenoptera allergy: no Urticaria: no Eczema:no History of recurrent infections suggestive of immunodeficency: no  Diagnostics: Spirometry:  Tracings reviewed. His effort: Good reproducible efforts. FVC: 5.36L FEV1: 4.51L, 98%  predicted FEV1/FVC ratio: 84% Interpretation: Spirometry consistent with normal pattern with 1% improvement in FEV1 post bronchodilator treatment. Clinically feeling unchanged.   Please see scanned spirometry results for details.  Skin Testing: Environmental allergy panel and select foods. Positive to grass, ragweed, weed, trees, mold, cat, dog. Negative to common foods.  Results discussed with patient/family.  Airborne Adult Perc - 08/29/22 1017     Time Antigen Placed 1017    Allergen Manufacturer Waynette Buttery    Location Back    Number of Test 59    1. Control-Buffer 50% Glycerol Negative    2. Control-Histamine 1 mg/ml 2+    3. Albumin saline Negative    4. Bahia Negative    5. [place] 4+    6. Johnson 3+    7. Kentucky Blue 3+    8. Meadow Fescue 4+    9. Perennial Rye 3+    10. Sweet Vernal 2+    11. Timothy Negative    12. Cocklebur 2+    13. Burweed Marshelder Negative    14. Ragweed, short Negative    15. Ragweed, Giant Negative    16. Plantain,  English Negative    17. Lamb's Quarters Negative    18. Sheep Sorrell Negative  19. Rough Pigweed Negative    20. Marsh Elder, Rough Negative    21. Mugwort, Common Negative    22. Ash mix Negative    23. Birch mix Negative    24. Beech American 2+    25. Box, Elder 2+    26. Cedar, red 3+    27. Cottonwood, [place] --   +/-   28. Elm mix --   +/-   29. Hickory Negative    30. Maple mix --   +/-   31. Oak, Guinea-[place] mix Negative    32. Pecan Pollen Negative    33. Pine mix Negative    34. Sycamore Eastern Negative    35. Walnut, Black Pollen Negative    36. Alternaria alternata Negative    37. Cladosporium Herbarum Negative    38. Aspergillus mix Negative    39. Penicillium mix Negative    40. Bipolaris sorokiniana (Helminthosporium) Negative    41. Drechslera spicifera (Curvularia) Negative    42. Mucor plumbeus Negative    43. Fusarium moniliforme Negative    44. Aureobasidium pullulans (pullulara) Negative     45. Rhizopus oryzae Negative    46. Botrytis cinera Negative    47. Epicoccum nigrum Negative    48. Phoma betae Negative    49. Candida Albicans Negative    50. Trichophyton mentagrophytes Negative    51. Mite, D Farinae  5,000 AU/ml Negative    52. Mite, D Pteronyssinus  5,000 AU/ml Negative    53. Cat Hair 10,000 BAU/ml Negative    54.  Dog Epithelia Negative    55. Mixed Feathers Negative    56. Horse Epithelia Negative    57. Cockroach, German Negative    58. Mouse Negative    59. Tobacco Leaf Negative             Food Perc - 08/29/22 1017       Test Information   Time Antigen Placed 1017    Allergen Manufacturer Waynette Buttery    Location Back    Number of allergen test 10      Food   1. Peanut Negative    2. Soybean food Negative    3. Wheat, whole Negative    4. Sesame Negative    5. Milk, cow Negative    6. Egg White, chicken Negative    7. Casein Negative    8. Shellfish mix Negative    9. Fish mix Negative    10. Cashew Negative             Intradermal - 08/29/22 1053     Time Antigen Placed 1053    Allergen Manufacturer Other    Location Arm    Number of Test 10    Control Negative    Ragweed mix 3+    Mold 1 2+    Mold 2 2+    Mold 3 2+    Mold 4 2+    Cat 2+    Dog 2+    Cockroach Negative    Mite mix Negative             Past Medical History: Patient Active Problem List   Diagnosis Date Noted   Other allergic rhinitis 08/29/2022   Allergic conjunctivitis of both eyes 08/29/2022   Penicillin allergy 08/29/2022   Gastroesophageal reflux disease 08/29/2022   Chronic cough 08/29/2022   Rash and other nonspecific skin eruption 08/29/2022   Vitamin D deficiency 08/08/2022   Impaired range of  motion of both shoulders 08/07/2022   Acquired kyphosis of thoracic spine due to poor posture 08/07/2022   TMJ (temporomandibular joint syndrome)- no pain, just clamps down 12/10/2018   Marital stress 12/10/2018   Mood disorder 07/13/2018    Indirect left inguinal hernia 01/01/2018   Breast mass, left 01/01/2018   Nicotine dependence 10/23/2016   Marijuana use, continuous 10/23/2016   Trauma in childhood 10/23/2016   H/O: attempted suicide as teen 10/23/2016   History reviewed. No pertinent past medical history. Past Surgical History: Past Surgical History:  Procedure Laterality Date   HERNIA REPAIR     Medication List:  Current Outpatient Medications  Medication Sig Dispense Refill   azelastine (ASTELIN) 0.1 % nasal spray Place 1-2 sprays into both nostrils 2 (two) times daily as needed (nasal drainage). Use in each nostril as directed 30 mL 5   fluticasone (FLONASE) 50 MCG/ACT nasal spray Place 1 spray into both nostrils 2 (two) times daily as needed (nasal congestion). 16 g 5   levocetirizine (XYZAL) 5 MG tablet Take 1 tablet (5 mg total) by mouth every evening. 30 tablet 5   montelukast (SINGULAIR) 10 MG tablet Take 1 tablet (10 mg total) by mouth at bedtime. 30 tablet 5   Olopatadine HCl 0.2 % SOLN Apply 1 drop to eye daily as needed. 2.5 mL 5   Vitamin D, Ergocalciferol, (DRISDOL) 1.25 MG (50000 UNIT) CAPS capsule Take 1 capsule (50,000 Units total) by mouth every 7 (seven) days. (Patient not taking: Reported on 08/29/2022) 5 capsule 0   No current facility-administered medications for this visit.   Allergies: Allergies  Allergen Reactions   Penicillins    Social History: Social History   Socioeconomic History   Marital status: Married    Spouse name: Not on file   Number of children: Not on file   Years of education: Not on file   Highest education level: Not on file  Occupational History   Not on file  Tobacco Use   Smoking status: Former    Packs/day: 1    Types: Cigarettes    Quit date: 04/2021    Years since quitting: 1.4    Passive exposure: Never   Smokeless tobacco: Never  Vaping Use   Vaping Use: Former   Devices: Quit September 2022  Substance and Sexual Activity   Alcohol use: Yes     Alcohol/week: 3.0 standard drinks of alcohol    Types: 3 Cans of beer per week    Comment: occ.    Drug use: No   Sexual activity: Not Currently    Partners: Female  Other Topics Concern   Not on file  Social History Narrative   Not on file   Social Determinants of Health   Financial Resource Strain: Low Risk  (08/07/2022)   Overall Financial Resource Strain (CARDIA)    Difficulty of Paying Living Expenses: Not hard at all  Food Insecurity: No Food Insecurity (08/07/2022)   Hunger Vital Sign    Worried About Running Out of Food in the Last Year: Never true    Ran Out of Food in the Last Year: Never true  Transportation Needs: No Transportation Needs (08/07/2022)   PRAPARE - Administrator, Civil Service (Medical): No    Lack of Transportation (Non-Medical): No  Physical Activity: Sufficiently Active (08/07/2022)   Exercise Vital Sign    Days of Exercise per Week: 7 days    Minutes of Exercise per Session: 40 min  Stress: No  Stress Concern Present (08/07/2022)   [employer] of Occupational Health - Occupational Stress Questionnaire    Feeling of Stress : Not at all  Social Connections: Moderately Isolated (08/07/2022)   Social Connection and Isolation Panel [NHANES]    Frequency of Communication with Friends and Family: More than three times a week    Frequency of Social Gatherings with Friends and Family: More than three times a week    Attends Religious Services: Never    Database administrator or Organizations: No    Attends Banker Meetings: Never    Marital Status: Married   Lives in a 36 year old house. Smoking: quit smoking in 2022, smoked for about 15 yrs Occupation: Acupuncturist HistorySurveyor, minerals in the house: no Engineer, civil (consulting) in the family room: no Carpet in the bedroom: yes Heating:  gas and electric Cooling: heat pump Pet: yes 4 dogs and 1 cat  Family History: Family History  Problem Relation Age of Onset   Diabetes  Mother    Allergic rhinitis Neg Hx    Angioedema Neg Hx    Asthma Neg Hx    Eczema Neg Hx    Urticaria Neg Hx    Review of Systems  Constitutional:  Negative for appetite change, chills, fever and unexpected weight change.  HENT:  Positive for congestion, postnasal drip, rhinorrhea and sneezing.   Eyes:  Negative for itching.  Respiratory:  Positive for cough. Negative for chest tightness, shortness of breath and wheezing.   Cardiovascular:  Negative for chest pain.  Gastrointestinal:  Negative for abdominal pain.  Genitourinary:  Negative for difficulty urinating.  Skin:  Negative for rash.  Allergic/Immunologic: Positive for environmental allergies. Negative for food allergies.  Neurological:  Negative for headaches.    Objective: BP 120/86   Pulse 80   Temp 98.1 F (36.7 C)   Resp 16   Ht 6' 0.75" (1.848 m)   Wt 185 lb 8 oz (84.1 kg)   SpO2 95%   BMI 24.64 kg/m  Body mass index is 24.64 kg/m. Physical Exam Vitals and nursing note reviewed.  Constitutional:      Appearance: Normal appearance. He is well-developed.  HENT:     Head: Normocephalic and atraumatic.     Right Ear: Tympanic membrane and external ear normal.     Left Ear: Tympanic membrane and external ear normal.     Nose: Nose normal.     Mouth/Throat:     Mouth: Mucous membranes are moist.     Pharynx: Oropharynx is clear.  Eyes:     Conjunctiva/sclera: Conjunctivae normal.  Cardiovascular:     Rate and Rhythm: Normal rate and regular rhythm.     Heart sounds: Normal heart sounds. No murmur heard.    No friction rub. No gallop.  Pulmonary:     Effort: Pulmonary effort is normal.     Breath sounds: Normal breath sounds. No wheezing, rhonchi or rales.  Musculoskeletal:     Cervical back: Neck supple.  Skin:    General: Skin is warm.     Findings: No rash.  Neurological:     Mental Status: He is alert and oriented to person, place, and time.  Psychiatric:        Behavior: Behavior normal.    The plan was reviewed with the patient/family, and all questions/concerned were addressed.  It was my pleasure to see Vencent today and participate in his care. Please feel free to contact me with any  questions or concerns.  Sincerely,  Rexene Alberts, DO Allergy & Immunology  [facility] office: [place] office: [phone]

## 2022-08-29 ENCOUNTER — Encounter: Payer: Self-pay | Admitting: Allergy

## 2022-08-29 ENCOUNTER — Other Ambulatory Visit: Payer: Self-pay

## 2022-08-29 ENCOUNTER — Ambulatory Visit: Payer: Medicaid Other | Admitting: Allergy

## 2022-08-29 VITALS — BP 120/86 | HR 80 | Temp 98.1°F | Resp 16 | Ht 72.75 in | Wt 185.5 lb

## 2022-08-29 DIAGNOSIS — Z880 Allergy status to penicillin: Secondary | ICD-10-CM | POA: Diagnosis not present

## 2022-08-29 DIAGNOSIS — R21 Rash and other nonspecific skin eruption: Secondary | ICD-10-CM | POA: Insufficient documentation

## 2022-08-29 DIAGNOSIS — J3089 Other allergic rhinitis: Secondary | ICD-10-CM | POA: Diagnosis not present

## 2022-08-29 DIAGNOSIS — R053 Chronic cough: Secondary | ICD-10-CM | POA: Diagnosis not present

## 2022-08-29 DIAGNOSIS — H1013 Acute atopic conjunctivitis, bilateral: Secondary | ICD-10-CM | POA: Insufficient documentation

## 2022-08-29 DIAGNOSIS — K219 Gastro-esophageal reflux disease without esophagitis: Secondary | ICD-10-CM | POA: Diagnosis not present

## 2022-08-29 DIAGNOSIS — H1010 Acute atopic conjunctivitis, unspecified eye: Secondary | ICD-10-CM | POA: Insufficient documentation

## 2022-08-29 MED ORDER — MONTELUKAST SODIUM 10 MG PO TABS: 10.0000 mg | ORAL_TABLET | Freq: Every day | ORAL | 5 refills | Status: AC

## 2022-08-29 MED ORDER — FLUTICASONE PROPIONATE 50 MCG/ACT NA SUSP: 1.0000 | Freq: Two times a day (BID) | NASAL | 5 refills | Status: AC | PRN

## 2022-08-29 MED ORDER — AZELASTINE HCL 0.1 % NA SOLN: 1.0000 | Freq: Two times a day (BID) | NASAL | 5 refills | Status: AC | PRN

## 2022-08-29 MED ORDER — LEVOCETIRIZINE DIHYDROCHLORIDE 5 MG PO TABS: 5.0000 mg | ORAL_TABLET | Freq: Every evening | ORAL | 5 refills | Status: AC

## 2022-08-29 MED ORDER — OLOPATADINE HCL 0.2 % OP SOLN: 1.0000 [drp] | Freq: Every day | OPHTHALMIC | 5 refills | Status: DC | PRN

## 2022-08-29 NOTE — Assessment & Plan Note (Signed)
>>  ASSESSMENT AND PLAN FOR OTHER ALLERGIC RHINITIS WRITTEN ON 08/29/2022  1:09 PM BY Ellamae Sia, DO  Rhinoconjunctivitis symptoms mainly in the spring and fall.  Tried Zyrtec with minimal benefit.  No prior allergy evaluation.  Likes being outdoors. Today's skin testing showed: Positive to grass, ragweed, weed, trees, mold, cat, dog. Start environmental control measures as below. Take Xyzal (levocetirizine) daily as needed. May take twice a day during allergy flares. May switch antihistamines every few months. Start Singulair (montelukast) 10mg  daily at night. Cautioned that in some children/adults can experience behavioral changes including hyperactivity, agitation, depression, sleep disturbances and suicidal ideations. These side effects are rare, but if you notice them you should notify me and discontinue Singulair (montelukast). Use Flonase (fluticasone) nasal spray 1 spray per nostril twice a day as needed for nasal congestion.  Use azelastine nasal spray 1-2 sprays per nostril twice a day as needed for runny nose/drainage. Nasal saline spray (i.e., Simply Saline) or nasal saline lavage (i.e., NeilMed) is recommended as needed and prior to medicated nasal sprays. Use olopatadine eye drops 0.2% once a day as needed for itchy/watery eyes. Sample given.  Consider allergy injections for long term control if above medications do not help the symptoms - handout given.

## 2022-08-29 NOTE — Assessment & Plan Note (Signed)
See handout for lifestyle and dietary modifications. 

## 2022-08-29 NOTE — Assessment & Plan Note (Signed)
Rhinoconjunctivitis symptoms mainly in the spring and fall.  Tried Zyrtec with minimal benefit.  No prior allergy evaluation.  Likes being outdoors. Today's skin testing showed: Positive to grass, ragweed, weed, trees, mold, cat, dog. Start environmental control measures as below. Take Xyzal (levocetirizine) daily as needed. May take twice a day during allergy flares. May switch antihistamines every few months. Start Singulair (montelukast)  daily at night. Cautioned that in some children/adults can experience behavioral changes including hyperactivity, agitation, depression, sleep disturbances and suicidal ideations. These side effects are rare, but if you notice them you should notify me and discontinue Singulair (montelukast). Use Flonase (fluticasone) nasal spray 1 spray per nostril twice a day as needed for nasal congestion.  Use azelastine nasal spray 1-2 sprays per nostril twice a day as needed for runny nose/drainage. Nasal saline spray (i.e., Simply Saline) or nasal saline lavage (i.e., NeilMed) is recommended as needed and prior to medicated nasal sprays. Use olopatadine eye drops 0.2% once a day as needed for itchy/watery eyes. Sample given.  Consider allergy injections for long term control if above medications do not help the symptoms - handout given.

## 2022-08-29 NOTE — Assessment & Plan Note (Signed)
.   See assessment and plan as above. 

## 2022-08-29 NOTE — Assessment & Plan Note (Signed)
Rash outbreak 1 month ago lasting 2 weeks on abdominal area. No triggers noted.  Etiology unclear - concerning for insect/bug bite or contact irritation. If this happens again, take picture and let us know.

## 2022-08-29 NOTE — Assessment & Plan Note (Signed)
Ex-smoker. Coughing at times. Reflux improved after quit smoking. Today's skin testing showed: Positive to grass, ragweed, weed, trees, mold, cat, dog. Negative to common foods.  Today's spirometry showed: normal pattern with 1% improvement in FEV1 post bronchodilator treatment. Clinically feeling unchanged.  Monitor symptoms. Based on clinical history and testing results, most likely from PND.  See plan as above for allergic rhinitis.

## 2022-08-29 NOTE — Patient Instructions (Addendum)
Today's skin testing showed: Positive to grass, ragweed, weed, trees, mold, cat, dog. Negative to common foods.   Results given.  Environmental allergies Start environmental control measures as below. Take Xyzal (levocetirizine) daily as needed. May take twice a day during allergy flares. May switch antihistamines every few months. Start Singulair (montelukast) 10mg  daily at night. Cautioned that in some children/adults can experience behavioral changes including hyperactivity, agitation, depression, sleep disturbances and suicidal ideations. These side effects are rare, but if you notice them you should notify me and discontinue Singulair (montelukast). Use Flonase (fluticasone) nasal spray 1 spray per nostril twice a day as needed for nasal congestion.  Use azelastine nasal spray 1-2 sprays per nostril twice a day as needed for runny nose/drainage. Nasal saline spray (i.e., Simply Saline) or nasal saline lavage (i.e., NeilMed) is recommended as needed and prior to medicated nasal sprays. Use olopatadine eye drops 0.2% once a day as needed for itchy/watery eyes. Sample given.   Consider allergy injections for long term control if above medications do not help the symptoms - handout given.   Rash Etiology unclear - concerning for insect/bug bite or contact irritation. If this happens again, take picture and let us know.  Heartburn: See handout for lifestyle and dietary modifications.  Penicillin allergy: Talk to your parents about what happened in the past.  Consider penicillin allergy skin testing and in office drug challenge in the future.  Over 90% of people with history of penicillin allergy which occurred over 10 years ago are found to be non-allergic.  You must be off antihistamines for 3-5 days before. Plan on being in the office for 2-3 hours. You must call to schedule an appointment and specify it's for a drug challenge.  A few days prior to the appointment, I will send in a  prescription for amoxicillin liquid which you must bring to the appointment as well.   Follow up in 2 months or sooner if needed.    Reducing Pollen Exposure Pollen seasons: trees (spring), grass (summer) and ragweed/weeds (fall). Keep windows closed in your home and car to lower pollen exposure.  Install air conditioning in the bedroom and throughout the house if possible.  Avoid going out in dry windy days - especially early morning. Pollen counts are highest between 5 - 10 AM and on dry, hot and windy days.  Save outside activities for late afternoon or after a heavy rain, when pollen levels are lower.  Avoid mowing of grass if you have grass pollen allergy. Be aware that pollen can also be transported indoors on people and pets.  Dry your clothes in an automatic dryer rather than hanging them outside where they might collect pollen.  Rinse hair and eyes before bedtime. Control of House Dust Mite Allergen Dust mite allergens are a common trigger of allergy and asthma symptoms. While they can be found throughout the house, these microscopic creatures thrive in warm, humid environments such as bedding, upholstered furniture and carpeting. Because so much time is spent in the bedroom, it is essential to reduce mite levels there.  Encase pillows, mattresses, and box springs in special allergen-proof fabric covers or airtight, zippered plastic covers.  Bedding should be washed weekly in hot water (130 F) and dried in a hot dryer. Allergen-proof covers are available for comforters and pillows that can't be regularly washed.  Wash the allergy-proof covers every few months. Minimize clutter in the bedroom. Keep pets out of the bedroom.  Keep humidity less than 50% by  using a dehumidifier or air conditioning. You can buy a humidity measuring device called a hygrometer to monitor this.  If possible, replace carpets with hardwood, linoleum, or washable area rugs. If that's not possible, vacuum  frequently with a vacuum that has a HEPA filter. Remove all upholstered furniture and non-washable window drapes from the bedroom. Remove all non-washable stuffed toys from the bedroom.  Wash stuffed toys weekly. Pet Allergen Avoidance: Contrary to popular opinion, there are no "hypoallergenic" breeds of dogs or cats. That is because people are not allergic to an animal's hair, but to an allergen found in the animal's saliva, dander (dead skin flakes) or urine. Pet allergy symptoms typically occur within minutes. For some people, symptoms can build up and become most severe 8 to 12 hours after contact with the animal. People with severe allergies can experience reactions in public places if dander has been transported on the pet owners' clothing. Keeping an animal outdoors is only a partial solution, since homes with pets in the yard still have higher concentrations of animal allergens. Before getting a pet, ask your allergist to determine if you are allergic to animals. If your pet is already considered part of your family, try to minimize contact and keep the pet out of the bedroom and other rooms where you spend a great deal of time. As with dust mites, vacuum carpets often or replace carpet with a hardwood floor, tile or linoleum. High-efficiency particulate air (HEPA) cleaners can reduce allergen levels over time. While dander and saliva are the source of cat and dog allergens, urine is the source of allergens from rabbits, hamsters, mice and [place] pigs; so ask a non-allergic family member to clean the animal's cage. If you have a pet allergy, talk to your allergist about the potential for allergy immunotherapy (allergy shots). This strategy can often provide long-term relief. Mold Control Mold and fungi can grow on a variety of surfaces provided certain temperature and moisture conditions exist.  Outdoor molds grow on plants, decaying vegetation and soil. The major outdoor mold, Alternaria and  Cladosporium, are found in very high numbers during hot and dry conditions. Generally, a late summer - fall peak is seen for common outdoor fungal spores. Rain will temporarily lower outdoor mold spore count, but counts rise rapidly when the rainy period ends. The most important indoor molds are Aspergillus and Penicillium. Dark, humid and poorly ventilated basements are ideal sites for mold growth. The next most common sites of mold growth are the bathroom and the kitchen. Outdoor (Seasonal) Mold Control Use air conditioning and keep windows closed. Avoid exposure to decaying vegetation. Avoid leaf raking. Avoid grain handling. Consider wearing a face mask if working in moldy areas.  Indoor (Perennial) Mold Control  Maintain humidity below 50%. Get rid of mold growth on hard surfaces with water, detergent and, if necessary, 5% bleach (do not mix with other cleaners). Then dry the area completely. If mold covers an area more than 10 square feet, consider hiring an indoor environmental professional. For clothing, washing with soap and water is best. If moldy items cannot be cleaned and dried, throw them away. Remove sources e.g. contaminated carpets. Repair and seal leaking roofs or pipes. Using dehumidifiers in damp basements may be helpful, but empty the water and clean units regularly to prevent mildew from forming. All rooms, especially basements, bathrooms and kitchens, require ventilation and cleaning to deter mold and mildew growth. Avoid carpeting on concrete or damp floors, and storing items in damp  areas.

## 2022-08-29 NOTE — Assessment & Plan Note (Signed)
Patient unsure of exact reaction. Advised patient to ask his parents about this penicillin allergy.  Consider penicillin allergy skin testing and in office drug challenge in the future.  Over 90% of people with history of penicillin allergy which occurred over 10 years ago are found to be non-allergic.

## 2022-10-04 ENCOUNTER — Ambulatory Visit (INDEPENDENT_AMBULATORY_CARE_PROVIDER_SITE_OTHER): Payer: Medicaid Other | Admitting: Family Medicine

## 2022-10-04 ENCOUNTER — Encounter: Payer: Self-pay | Admitting: Family Medicine

## 2022-10-04 VITALS — BP 118/77 | HR 115 | Resp 18 | Ht 72.75 in | Wt 186.0 lb

## 2022-10-04 DIAGNOSIS — Z800 Family history of malignant neoplasm of digestive organs: Secondary | ICD-10-CM | POA: Diagnosis not present

## 2022-10-04 DIAGNOSIS — Z9189 Other specified personal risk factors, not elsewhere classified: Secondary | ICD-10-CM | POA: Diagnosis not present

## 2022-10-04 DIAGNOSIS — Z1211 Encounter for screening for malignant neoplasm of colon: Secondary | ICD-10-CM | POA: Diagnosis not present

## 2022-10-04 DIAGNOSIS — Z0000 Encounter for general adult medical examination without abnormal findings: Secondary | ICD-10-CM

## 2022-10-04 DIAGNOSIS — Z1212 Encounter for screening for malignant neoplasm of rectum: Secondary | ICD-10-CM

## 2022-10-04 DIAGNOSIS — E559 Vitamin D deficiency, unspecified: Secondary | ICD-10-CM

## 2022-10-04 MED ORDER — VITAMIN D (ERGOCALCIFEROL) 1.25 MG (50000 UNIT) PO CAPS: 50000.0000 [IU] | ORAL_CAPSULE | ORAL | 0 refills | Status: DC

## 2022-10-04 NOTE — Patient Instructions (Signed)
Pick up the vitamin D prescription that should be at [facility] drug.  You will take this once a week for a few weeks, after which time your vitamin D levels should be at a normal level that you will be able to maintain either through food containing vitamin D or an over-the-counter vitamin D3 2000 unit daily supplement.

## 2022-10-04 NOTE — Addendum Note (Signed)
Addended by: Saralyn Pilar on: 10/04/2022 09:12 AM   Modules accepted: Orders

## 2022-10-04 NOTE — Progress Notes (Signed)
Complete physical exam  Patient: Philip Todd   DOB: Dec 25, 1985   37 y.o. Male  MRN: [id number]  Subjective:    Chief Complaint  Patient presents with   Annual Exam    Philip Todd is a 37 y.o. male who presents today for a complete physical exam. He reports consuming a general diet.  He generally feels well.  He was able to get more information on his mother's health history, she was diagnosed with cancerous colon polyps in her late 42s or early 65s.  He does not have additional problems to discuss today.  He did go to the allergist and was prescribed 2 different nasal sprays as well as montelukast and Xyzal.  He admits that he has not taken either of the pills, but he has been using the nasal sprays.   Most recent fall risk assessment:    08/07/2022    9:26 AM  Fall Risk   Falls in the past year? 0  Number falls in past yr: 0  Injury with Fall? 0  Risk for fall due to : No Fall Risks     Most recent depression and anxiety screenings:    10/04/2022    8:40 AM 08/07/2022    9:26 AM  PHQ 2/9 Scores  PHQ - 2 Score 0 0  PHQ- 9 Score 2 5      10/04/2022    8:41 AM 08/07/2022    9:26 AM 12/10/2018    9:42 AM 09/14/2018    3:09 PM  GAD 7 : Generalized Anxiety Score  Nervous, Anxious, on Edge 0 1 1 0  Control/stop worrying 0 0 0 0  Worry too much - different things 0 0 0 1  Trouble relaxing 0 1 0 1  Restless 0 0 0 0  Easily annoyed or irritable 1 0 0 1  Afraid - awful might happen 0 0 0 0  Total GAD 7 Score 1 2 1 3   Anxiety Difficulty Not difficult at all Not difficult at all Not difficult at all Somewhat difficult    Patient Active Problem List   Diagnosis Date Noted   Other allergic rhinitis 08/29/2022   Allergic conjunctivitis of both eyes 08/29/2022   Penicillin allergy 08/29/2022   Gastroesophageal reflux disease 08/29/2022   Chronic cough 08/29/2022   Rash and other nonspecific skin eruption 08/29/2022   Vitamin D deficiency 08/08/2022   Impaired range of motion  of both shoulders 08/07/2022   Acquired kyphosis of thoracic spine due to poor posture 08/07/2022   TMJ (temporomandibular joint syndrome)- no pain, just clamps down 12/10/2018   Marital stress 12/10/2018   Mood disorder (HCC) 07/13/2018   Indirect left inguinal hernia 01/01/2018   Breast mass, left 01/01/2018   Nicotine dependence 10/23/2016   Marijuana use, continuous 10/23/2016   Trauma in childhood 10/23/2016   H/O: attempted suicide as teen 10/23/2016    Past Surgical History:  Procedure Laterality Date   HERNIA REPAIR     Social History   Tobacco Use   Smoking status: Former    Packs/day: 1    Types: Cigarettes    Quit date: 04/2021    Years since quitting: 1.4    Passive exposure: Never   Smokeless tobacco: Never  Vaping Use   Vaping Use: Former   Devices: Quit September 2022  Substance Use Topics   Alcohol use: Yes    Alcohol/week: 3.0 standard drinks of alcohol    Types: 3 Cans of beer per  week    Comment: occ.    Drug use: No   Family History  Problem Relation Age of Onset   Diabetes Mother    Allergic rhinitis Neg Hx    Angioedema Neg Hx    Asthma Neg Hx    Eczema Neg Hx    Urticaria Neg Hx    Allergies  Allergen Reactions   Penicillins      Patient Care Team: [name], PA as PCP - General (Family Medicine)   Outpatient Medications Prior to Visit  Medication Sig   azelastine (ASTELIN) 0.1 % nasal spray Place 1-2 sprays into both nostrils 2 (two) times daily as needed (nasal drainage). Use in each nostril as directed   fluticasone (FLONASE) 50 MCG/ACT nasal spray Place 1 spray into both nostrils 2 (two) times daily as needed (nasal congestion).   levocetirizine (XYZAL) 5 MG tablet Take 1 tablet (5 mg total) by mouth every evening.   montelukast (SINGULAIR) 10 MG tablet Take 1 tablet (10 mg total) by mouth at bedtime.   Olopatadine HCl 0.2 % SOLN Apply 1 drop to eye daily as needed.   [DISCONTINUED] Vitamin D, Ergocalciferol, (DRISDOL)  1.25 MG (50000 UNIT) CAPS capsule Take 1 capsule (50,000 Units total) by mouth every 7 (seven) days. (Patient not taking: Reported on 08/29/2022)   No facility-administered medications prior to visit.    Review of Systems  Constitutional:  Negative for chills, fever and malaise/fatigue.  HENT:  Negative for congestion and hearing loss.   Eyes:  Negative for blurred vision and double vision.  Respiratory:  Negative for cough and shortness of breath.   Cardiovascular:  Negative for chest pain, palpitations and leg swelling.  Gastrointestinal:  Negative for abdominal pain, constipation, diarrhea and heartburn.  Genitourinary:  Negative for frequency and urgency.  Musculoskeletal:  Negative for myalgias and neck pain.  Neurological:  Negative for headaches.  Endo/Heme/Allergies:  Negative for polydipsia.  Psychiatric/Behavioral:  Negative for depression. The patient is not nervous/anxious.       Objective:    BP 118/77 (BP Location: Left Arm, Patient Position: Sitting, Cuff Size: Normal)   Pulse (!) 115   Resp 18   Ht 6' 0.75" (1.848 m)   Wt 186 lb (84.4 kg)   SpO2 95%   BMI 24.71 kg/m    Physical Exam Constitutional:      General: He is not in acute distress.    Appearance: Normal appearance.  HENT:     Head: Normocephalic and atraumatic.     Right Ear: Tympanic membrane, ear canal and external ear normal. There is no impacted cerumen.     Left Ear: Tympanic membrane, ear canal and external ear normal. There is no impacted cerumen.     Nose: Nose normal. No congestion or rhinorrhea.     Mouth/Throat:     Mouth: Mucous membranes are moist.     Pharynx: Oropharynx is clear.  Eyes:     Conjunctiva/sclera: Conjunctivae normal.     Pupils: Pupils are equal, round, and reactive to light.  Neck:     Thyroid: No thyroid mass, thyromegaly or thyroid tenderness.  Cardiovascular:     Rate and Rhythm: Normal rate and regular rhythm.     Pulses: Normal pulses.     Heart sounds:  Normal heart sounds. No murmur heard.    No friction rub. No gallop.  Pulmonary:     Effort: Pulmonary effort is normal.     Breath sounds: Normal breath sounds. No  wheezing, rhonchi or rales.  Abdominal:     General: Bowel sounds are normal. There is no distension.     Palpations: Abdomen is soft. There is no mass.     Tenderness: There is no abdominal tenderness.  Musculoskeletal:        General: No swelling. Normal range of motion.     Cervical back: Normal range of motion and neck supple.  Lymphadenopathy:     Cervical: No cervical adenopathy.  Skin:    General: Skin is warm and dry.  Neurological:     Mental Status: He is alert and oriented to person, place, and time.     Cranial Nerves: No cranial nerve deficit.     Motor: No weakness.     Deep Tendon Reflexes: Reflexes normal.  Psychiatric:        Mood and Affect: Mood normal.       Assessment & Plan:    Routine Health Maintenance and Physical Exam  Immunization History  Administered Date(s) Administered   Influenza Inj Mdck Quad Pf 03/04/2018   Td 06/19/2017    Health Maintenance  Topic Date Due   COVID-19 Vaccine (1) Never done   INFLUENZA VACCINE  12/05/2022   DTaP/Tdap/Td (2 - Tdap) 06/20/2027   Hepatitis C Screening  Completed   HIV Screening  Completed   HPV VACCINES  Aged Out   Patient is up-to-date on all preventative care.  Due to first-degree relative (mother) with history of cancerous colon polyps found by the time she was 36 years old, patient is eligible for starting screening colonoscopy now.  Referral has been sent into [facility].  Discussed health benefits of physical activity, and encouraged him to engage in regular exercise appropriate for his age and condition.  Wellness examination  Screening for colorectal cancer -     Ambulatory referral to Gastroenterology  Family history of malignant neoplasm of colon in first degree relative diagnosed when younger than 36 years of  age -     Ambulatory referral to Gastroenterology  High risk for colon cancer -     Ambulatory referral to Gastroenterology    Return in about 6 months (around 04/05/2023) for follow-up for mood.     [name], PA

## 2022-11-11 NOTE — Progress Notes (Unsigned)
Follow Up Note  RE: Philip Todd MRN: [id number] DOB: 10-15-1985 Date of Office Visit: 11/12/2022  Referring provider: [name], PA Primary care provider: [name], PA  Chief Complaint: No chief complaint on file.  History of Present Illness: I had the pleasure of seeing Philip Todd for a follow up visit at the [facility] on 11/11/2022. He is a 37 y.o. male, who is being followed for allergic rhinoconjunctivitis, rash, chronic cough, GERD, penicillin allergy. His previous allergy office visit was on 08/29/2022 with Dr. Selena Batten. Today is a regular follow up visit.  Other allergic rhinitis Rhinoconjunctivitis symptoms mainly in the spring and fall.  Tried Zyrtec with minimal benefit.  No prior allergy evaluation.  Likes being outdoors. Today's skin testing showed: Positive to grass, ragweed, weed, trees, mold, cat, dog. Start environmental control measures as below. Take Xyzal (levocetirizine) daily as needed. May take twice a day during allergy flares. May switch antihistamines every few months. Start Singulair (montelukast) 10mg  daily at night. Cautioned that in some children/adults can experience behavioral changes including hyperactivity, agitation, depression, sleep disturbances and suicidal ideations. These side effects are rare, but if you notice them you should notify me and discontinue Singulair (montelukast). Use Flonase (fluticasone) nasal spray 1 spray per nostril twice a day as needed for nasal congestion.  Use azelastine nasal spray 1-2 sprays per nostril twice a day as needed for runny nose/drainage. Nasal saline spray (i.e., Simply Saline) or nasal saline lavage (i.e., NeilMed) is recommended as needed and prior to medicated nasal sprays. Use olopatadine eye drops 0.2% once a day as needed for itchy/watery eyes. Sample given.  Consider allergy injections for long term control if above medications do not help the symptoms - handout given.     Allergic conjunctivitis of both eyes See assessment and plan as above.   Rash and other nonspecific skin eruption Rash outbreak 1 month ago lasting 2 weeks on abdominal area. No triggers noted.  Etiology unclear - concerning for insect/bug bite or contact irritation. If this happens again, take picture and let us know.   Chronic cough Ex-smoker. Coughing at times. Reflux improved after quit smoking. Today's skin testing showed: Positive to grass, ragweed, weed, trees, mold, cat, dog. Negative to common foods.  Today's spirometry showed: normal pattern with 1% improvement in FEV1 post bronchodilator treatment. Clinically feeling unchanged.  Monitor symptoms. Based on clinical history and testing results, most likely from PND.  See plan as above for allergic rhinitis.    Gastroesophageal reflux disease See handout for lifestyle and dietary modifications.   Penicillin allergy Patient unsure of exact reaction. Advised patient to ask his parents about this penicillin allergy.  Consider penicillin allergy skin testing and in office drug challenge in the future.  Over 90% of people with history of penicillin allergy which occurred over 10 years ago are found to be non-allergic.    Return in about 2 months (around 10/29/2022).  Assessment and Plan: Philip Todd is a 37 y.o. male with: No problem-specific Assessment & Plan notes found for this encounter.  No follow-ups on file.  No orders of the defined types were placed in this encounter.  Lab Orders  No laboratory test(s) ordered today    Diagnostics: Spirometry:  Tracings reviewed. His effort: {Blank single:19197::"Good reproducible efforts.","It was hard to get consistent efforts and there is a question as to whether this reflects a maximal maneuver.","Poor effort, data can not be interpreted."} FVC: ***L FEV1: ***L, ***% predicted  FEV1/FVC ratio: ***% Interpretation: {Blank single:19197::"Spirometry consistent with mild  obstructive disease","Spirometry consistent with moderate obstructive disease","Spirometry consistent with severe obstructive disease","Spirometry consistent with possible restrictive disease","Spirometry consistent with mixed obstructive and restrictive disease","Spirometry uninterpretable due to technique","Spirometry consistent with normal pattern","No overt abnormalities noted given today's efforts"}.  Please see scanned spirometry results for details.  Skin Testing: {Blank single:19197::"Select foods","Environmental allergy panel","Environmental allergy panel and select foods","Food allergy panel","None","Deferred due to recent antihistamines use"}. *** Results discussed with patient/family.   Medication List:  Current Outpatient Medications  Medication Sig Dispense Refill  . azelastine (ASTELIN) 0.1 % nasal spray Place 1-2 sprays into both nostrils 2 (two) times daily as needed (nasal drainage). Use in each nostril as directed 30 mL 5  . fluticasone (FLONASE) 50 MCG/ACT nasal spray Place 1 spray into both nostrils 2 (two) times daily as needed (nasal congestion). 16 g 5  . levocetirizine (XYZAL) 5 MG tablet Take 1 tablet (5 mg total) by mouth every evening. 30 tablet 5  . montelukast (SINGULAIR) 10 MG tablet Take 1 tablet (10 mg total) by mouth at bedtime. 30 tablet 5  . Olopatadine HCl 0.2 % SOLN Apply 1 drop to eye daily as needed. 2.5 mL 5  . Vitamin D, Ergocalciferol, (DRISDOL) 1.25 MG (50000 UNIT) CAPS capsule Take 1 capsule (50,000 Units total) by mouth every 7 (seven) days. 8 capsule 0   No current facility-administered medications for this visit.   Allergies: Allergies  Allergen Reactions  . Penicillins    I reviewed his past medical history, social history, family history, and environmental history and no significant changes have been reported from his previous visit.  Review of Systems  Constitutional:  Negative for appetite change, chills, fever and unexpected weight  change.  HENT:  Positive for congestion, postnasal drip, rhinorrhea and sneezing.   Eyes:  Negative for itching.  Respiratory:  Positive for cough. Negative for chest tightness, shortness of breath and wheezing.   Cardiovascular:  Negative for chest pain.  Gastrointestinal:  Negative for abdominal pain.  Genitourinary:  Negative for difficulty urinating.  Skin:  Negative for rash.  Allergic/Immunologic: Positive for environmental allergies. Negative for food allergies.  Neurological:  Negative for headaches.   Objective: There were no vitals taken for this visit. There is no height or weight on file to calculate BMI. Physical Exam Vitals and nursing note reviewed.  Constitutional:      Appearance: Normal appearance. He is well-developed.  HENT:     Head: Normocephalic and atraumatic.     Right Ear: Tympanic membrane and external ear normal.     Left Ear: Tympanic membrane and external ear normal.     Nose: Nose normal.     Mouth/Throat:     Mouth: Mucous membranes are moist.     Pharynx: Oropharynx is clear.  Eyes:     Conjunctiva/sclera: Conjunctivae normal.  Cardiovascular:     Rate and Rhythm: Normal rate and regular rhythm.     Heart sounds: Normal heart sounds. No murmur heard.    No friction rub. No gallop.  Pulmonary:     Effort: Pulmonary effort is normal.     Breath sounds: Normal breath sounds. No wheezing, rhonchi or rales.  Musculoskeletal:     Cervical back: Neck supple.  Skin:    General: Skin is warm.     Findings: No rash.  Neurological:     Mental Status: He is alert and oriented to person, place, and time.  Psychiatric:  Behavior: Behavior normal.  Previous notes and tests were reviewed. The plan was reviewed with the patient/family, and all questions/concerned were addressed.  It was my pleasure to see Philip Todd today and participate in his care. Please feel free to contact me with any questions or concerns.  Sincerely,  Wyline Mood, DO Allergy &  Immunology  [facility] office: [phone] [facility] office: [phone]

## 2022-11-12 ENCOUNTER — Encounter: Payer: Self-pay | Admitting: Allergy

## 2022-11-12 ENCOUNTER — Other Ambulatory Visit: Payer: Self-pay

## 2022-11-12 ENCOUNTER — Other Ambulatory Visit (HOSPITAL_COMMUNITY): Payer: Self-pay

## 2022-11-12 ENCOUNTER — Ambulatory Visit (INDEPENDENT_AMBULATORY_CARE_PROVIDER_SITE_OTHER): Payer: Medicaid Other | Admitting: Allergy

## 2022-11-12 ENCOUNTER — Telehealth: Payer: Self-pay

## 2022-11-12 VITALS — BP 122/86 | HR 95 | Temp 98.3°F | Resp 16 | Wt 185.2 lb

## 2022-11-12 DIAGNOSIS — R053 Chronic cough: Secondary | ICD-10-CM

## 2022-11-12 DIAGNOSIS — Z880 Allergy status to penicillin: Secondary | ICD-10-CM

## 2022-11-12 DIAGNOSIS — K219 Gastro-esophageal reflux disease without esophagitis: Secondary | ICD-10-CM | POA: Diagnosis not present

## 2022-11-12 DIAGNOSIS — J302 Other seasonal allergic rhinitis: Secondary | ICD-10-CM

## 2022-11-12 DIAGNOSIS — H1010 Acute atopic conjunctivitis, unspecified eye: Secondary | ICD-10-CM

## 2022-11-12 DIAGNOSIS — H1013 Acute atopic conjunctivitis, bilateral: Secondary | ICD-10-CM

## 2022-11-12 DIAGNOSIS — J3089 Other allergic rhinitis: Secondary | ICD-10-CM

## 2022-11-12 MED ORDER — AZELASTINE-FLUTICASONE 137-50 MCG/ACT NA SUSP: 1.0000 | Freq: Two times a day (BID) | NASAL | 5 refills | Status: DC

## 2022-11-12 MED ORDER — OLOPATADINE HCL 0.2 % OP SOLN: 1.0000 [drp] | Freq: Every day | OPHTHALMIC | 5 refills | Status: AC | PRN

## 2022-11-12 MED ORDER — DYMISTA 137-50 MCG/ACT NA SUSP: 2.0000 | NASAL | 5 refills | Status: DC

## 2022-11-12 NOTE — Assessment & Plan Note (Signed)
Past history - rhinoconjunctivitis symptoms mainly in the spring and fall.  Tried Zyrtec with minimal benefit.   Likes being outdoors. 2024 skin testing showed: Positive to grass, ragweed, weed, trees, mold, cat, dog. Interim history - much improved with Flonase and azelastine nasal spray. Didn't try Xyzal or Singulair. Needs refills.  Continue environmental control measures as below. Take Xyzal (levocetirizine) daily as needed. May take twice a day during allergy flares. May switch antihistamines every few months. May use Singulair (montelukast) 10mg  daily at night as needed.  Cautioned that in some children/adults can experience behavioral changes including hyperactivity, agitation, depression, sleep disturbances and suicidal ideations. These side effects are rare, but if you notice them you should notify me and discontinue Singulair (montelukast). Start dymista (fluticasone + azelastine nasal spray combination) 1 spray per nostril twice a day. If this is not covered then go back to using Flonase and azelastine separately.  Nasal saline spray (i.e., Simply Saline) or nasal saline lavage (i.e., NeilMed) is recommended as needed and prior to medicated nasal sprays. Use olopatadine eye drops 0.2% once a day as needed for itchy/watery eyes.  Consider allergy injections for long term control if above medications do not help the symptoms.

## 2022-11-12 NOTE — Telephone Encounter (Signed)
Resending prescription in as Brand Dymista.

## 2022-11-12 NOTE — Assessment & Plan Note (Signed)
Past history - ex-smoker. Coughing at times. Reflux improved after quit smoking. 2024 skin testing showed: Positive to grass, ragweed, weed, trees, mold, cat, dog. Negative to common foods. 2024 spirometry showed: normal pattern with 1% improvement in FEV1 post bronchodilator treatment. Clinically feeling unchanged.  Interim history - improved with the nasal sprays.  Monitor symptoms. Based on clinical history and testing results, most likely from PND.  See plan as above for allergic rhinitis.

## 2022-11-12 NOTE — Assessment & Plan Note (Signed)
Past history - patient and parents unsure of exact reaction. Consider penicillin allergy skin testing and in office drug challenge in the future.  Over 90% of people with history of penicillin allergy which occurred over 10 years ago are found to be non-allergic.

## 2022-11-12 NOTE — Telephone Encounter (Signed)
Patient Advocate Encounter   Received notification from [facility] that prior authorization is required for Azelastine-Fluticasone 137-50MCG/ACT suspension   Submitted: n/a Key n/a  PA not submitted at this time as it is covered when processed as [facility]

## 2022-11-12 NOTE — Patient Instructions (Addendum)
Environmental allergies 2024 skin testing showed: Positive to grass, ragweed, weed, trees, mold, cat, dog. Continue environmental control measures as below. Take Xyzal (levocetirizine) daily as needed. May take twice a day during allergy flares. May switch antihistamines every few months. May use Singulair (montelukast) 10mg  daily at night as needed.  Cautioned that in some children/adults can experience behavioral changes including hyperactivity, agitation, depression, sleep disturbances and suicidal ideations. These side effects are rare, but if you notice them you should notify me and discontinue Singulair (montelukast). Start dymista (fluticasone + azelastine nasal spray combination) 1 spray per nostril twice a day. If this is not covered then go back to using Flonase and azelastine separately.  Nasal saline spray (i.e., Simply Saline) or nasal saline lavage (i.e., NeilMed) is recommended as needed and prior to medicated nasal sprays. Use olopatadine eye drops 0.2% once a day as needed for itchy/watery eyes.   Consider allergy injections for long term control if above medications do not help the symptoms.   Heartburn: Continue lifestyle and dietary modifications. May take omeprazole 20mg  once a day as needed. Nothing to eat or drink for 30 min afterwards.   Penicillin allergy: Consider penicillin allergy skin testing and in office drug challenge in the future.  Over 90% of people with history of penicillin allergy which occurred over 10 years ago are found to be non-allergic.  You must be off antihistamines for 3-5 days before. Plan on being in the office for 2-3 hours. You must call to schedule an appointment and specify it's for a drug challenge.  A few days prior to the appointment, I will send in a prescription for amoxicillin liquid which you must bring to the appointment as well.   Follow up in 6 months or sooner if needed.    Reducing Pollen Exposure Pollen seasons: trees  (spring), grass (summer) and ragweed/weeds (fall). Keep windows closed in your home and car to lower pollen exposure.  Install air conditioning in the bedroom and throughout the house if possible.  Avoid going out in dry windy days - especially early morning. Pollen counts are highest between 5 - 10 AM and on dry, hot and windy days.  Save outside activities for late afternoon or after a heavy rain, when pollen levels are lower.  Avoid mowing of grass if you have grass pollen allergy. Be aware that pollen can also be transported indoors on people and pets.  Dry your clothes in an automatic dryer rather than hanging them outside where they might collect pollen.  Rinse hair and eyes before bedtime. Control of House Dust Mite Allergen Dust mite allergens are a common trigger of allergy and asthma symptoms. While they can be found throughout the house, these microscopic creatures thrive in warm, humid environments such as bedding, upholstered furniture and carpeting. Because so much time is spent in the bedroom, it is essential to reduce mite levels there.  Encase pillows, mattresses, and box springs in special allergen-proof fabric covers or airtight, zippered plastic covers.  Bedding should be washed weekly in hot water (130 F) and dried in a hot dryer. Allergen-proof covers are available for comforters and pillows that can't be regularly washed.  Wash the allergy-proof covers every few months. Minimize clutter in the bedroom. Keep pets out of the bedroom.  Keep humidity less than 50% by using a dehumidifier or air conditioning. You can buy a humidity measuring device called a hygrometer to monitor this.  If possible, replace carpets with hardwood, linoleum, or washable area  rugs. If that's not possible, vacuum frequently with a vacuum that has a HEPA filter. Remove all upholstered furniture and non-washable window drapes from the bedroom. Remove all non-washable stuffed toys from the bedroom.   Wash stuffed toys weekly. Pet Allergen Avoidance: Contrary to popular opinion, there are no "hypoallergenic" breeds of dogs or cats. That is because people are not allergic to an animal's hair, but to an allergen found in the animal's saliva, dander (dead skin flakes) or urine. Pet allergy symptoms typically occur within minutes. For some people, symptoms can build up and become most severe 8 to 12 hours after contact with the animal. People with severe allergies can experience reactions in public places if dander has been transported on the pet owners' clothing. Keeping an animal outdoors is only a partial solution, since homes with pets in the yard still have higher concentrations of animal allergens. Before getting a pet, ask your allergist to determine if you are allergic to animals. If your pet is already considered part of your family, try to minimize contact and keep the pet out of the bedroom and other rooms where you spend a great deal of time. As with dust mites, vacuum carpets often or replace carpet with a hardwood floor, tile or linoleum. High-efficiency particulate air (HEPA) cleaners can reduce allergen levels over time. While dander and saliva are the source of cat and dog allergens, urine is the source of allergens from rabbits, hamsters, mice and [place] pigs; so ask a non-allergic family member to clean the animal's cage. If you have a pet allergy, talk to your allergist about the potential for allergy immunotherapy (allergy shots). This strategy can often provide long-term relief. Mold Control Mold and fungi can grow on a variety of surfaces provided certain temperature and moisture conditions exist.  Outdoor molds grow on plants, decaying vegetation and soil. The major outdoor mold, Alternaria and Cladosporium, are found in very high numbers during hot and dry conditions. Generally, a late summer - fall peak is seen for common outdoor fungal spores. Rain will temporarily lower outdoor  mold spore count, but counts rise rapidly when the rainy period ends. The most important indoor molds are Aspergillus and Penicillium. Dark, humid and poorly ventilated basements are ideal sites for mold growth. The next most common sites of mold growth are the bathroom and the kitchen. Outdoor (Seasonal) Mold Control Use air conditioning and keep windows closed. Avoid exposure to decaying vegetation. Avoid leaf raking. Avoid grain handling. Consider wearing a face mask if working in moldy areas.  Indoor (Perennial) Mold Control  Maintain humidity below 50%. Get rid of mold growth on hard surfaces with water, detergent and, if necessary, 5% bleach (do not mix with other cleaners). Then dry the area completely. If mold covers an area more than 10 square feet, consider hiring an indoor environmental professional. For clothing, washing with soap and water is best. If moldy items cannot be cleaned and dried, throw them away. Remove sources e.g. contaminated carpets. Repair and seal leaking roofs or pipes. Using dehumidifiers in damp basements may be helpful, but empty the water and clean units regularly to prevent mildew from forming. All rooms, especially basements, bathrooms and kitchens, require ventilation and cleaning to deter mold and mildew growth. Avoid carpeting on concrete or damp floors, and storing items in damp areas.

## 2022-11-12 NOTE — Assessment & Plan Note (Signed)
Continue lifestyle and dietary modifications. May take omeprazole 20mg  once a day as needed. Nothing to eat or drink for 30 min afterwards.

## 2023-04-07 ENCOUNTER — Ambulatory Visit (INDEPENDENT_AMBULATORY_CARE_PROVIDER_SITE_OTHER): Payer: Medicaid Other | Admitting: Family Medicine

## 2023-04-07 ENCOUNTER — Encounter: Payer: Self-pay | Admitting: Family Medicine

## 2023-04-07 VITALS — BP 122/80 | HR 96 | Resp 20 | Ht 72.75 in | Wt 175.0 lb

## 2023-04-07 DIAGNOSIS — F39 Unspecified mood [affective] disorder: Secondary | ICD-10-CM | POA: Diagnosis not present

## 2023-04-07 DIAGNOSIS — R61 Generalized hyperhidrosis: Secondary | ICD-10-CM | POA: Insufficient documentation

## 2023-04-07 NOTE — Progress Notes (Signed)
Established Patient Office Visit  Subjective   Patient ID: Philip Todd, male    DOB: [birth year]  Age: 37 y.o. MRN: [id number]  Chief Complaint  Patient presents with   Anxiety   Depression    HPI Philip Todd is a 37 y.o. male presenting today for follow up of mood, currently managing with nonpharmacologic strategies. Denies mood changes or SI/HI. He feels mood is fairly stable since last visit.  He also notes that he has episodes of significant night sweats leaving him drenched in sweat.  These episodes have been occurring every few months for several years with no identifiable trigger or alleviating factors.  Denies dysphagia, chest pain, palpitations, dizziness, fever, fatigue, abdominal pain.     04/07/2023   10:11 AM 10/04/2022    8:40 AM 08/07/2022    9:26 AM  Depression screen PHQ 2/9  Decreased Interest 0 0 0  Down, Depressed, Hopeless 0 0 0  PHQ - 2 Score 0 0 0  Altered sleeping 2 0 3  Tired, decreased energy 1 1 1   Change in appetite 0 0 0  Feeling bad or failure about yourself  0 0 0  Trouble concentrating 2 1 1   Moving slowly or fidgety/restless 0 0 0  Suicidal thoughts 0  0  PHQ-9 Score 5 2 5   Difficult doing work/chores Not difficult at all Not difficult at all Not difficult at all       04/07/2023   10:12 AM 10/04/2022    8:41 AM 08/07/2022    9:26 AM 12/10/2018    9:42 AM  GAD 7 : Generalized Anxiety Score  Nervous, Anxious, on Edge 0 0 1 1  Control/stop worrying 0 0 0 0  Worry too much - different things 0 0 0 0  Trouble relaxing 0 0 1 0  Restless 0 0 0 0  Easily annoyed or irritable 0 1 0 0  Afraid - awful might happen 0 0 0 0  Total GAD 7 Score 0 1 2 1   Anxiety Difficulty Not difficult at all Not difficult at all Not difficult at all Not difficult at all     Outpatient Medications Prior to Visit  Medication Sig   azelastine (ASTELIN) 0.1 % nasal spray Place 1-2 sprays into both nostrils 2 (two) times daily as needed (nasal drainage). Use in each  nostril as directed   Azelastine-Fluticasone 137-50 MCG/ACT SUSP Place 1 spray into the nose in the morning and at bedtime.   Azelastine-Fluticasone 137-50 MCG/ACT SUSP Place 1 spray into the nose 2 (two) times daily.   DYMISTA 137-50 MCG/ACT SUSP Place 2 sprays into both nostrils 1 day or 1 dose.   fluticasone (FLONASE) 50 MCG/ACT nasal spray Place 1 spray into both nostrils 2 (two) times daily as needed (nasal congestion).   levocetirizine (XYZAL) 5 MG tablet Take 1 tablet (5 mg total) by mouth every evening.   montelukast (SINGULAIR) 10 MG tablet Take 1 tablet (10 mg total) by mouth at bedtime.   Olopatadine HCl 0.2 % SOLN Apply 1 drop to eye daily as needed (itchy/watery eyes).   No facility-administered medications prior to visit.    ROS Negative unless otherwise noted in HPI   Objective:     BP 122/80 (BP Location: Left Arm, Patient Position: Sitting, Cuff Size: Normal)   Pulse 96   Resp 20   Ht 6' 0.75" (1.848 m)   Wt 175 lb (79.4 kg)   SpO2 99%   BMI 23.25 kg/m  Physical Exam Constitutional:      General: He is not in acute distress.    Appearance: Normal appearance.  HENT:     Head: Normocephalic and atraumatic.  Neck:     Thyroid: No thyroid mass, thyromegaly or thyroid tenderness.  Cardiovascular:     Rate and Rhythm: Normal rate and regular rhythm.     Heart sounds: Normal heart sounds. No murmur heard.    No friction rub. No gallop.  Pulmonary:     Effort: Pulmonary effort is normal. No respiratory distress.     Breath sounds: Normal breath sounds. No wheezing, rhonchi or rales.  Musculoskeletal:     Cervical back: Normal range of motion.  Skin:    General: Skin is warm and dry.  Neurological:     Mental Status: He is alert and oriented to person, place, and time.  Psychiatric:        Mood and Affect: Mood normal.     Assessment & Plan:  Mood disorder (HCC) Assessment & Plan: PHQ-9 score 5 with PHQ 2 score 0, GAD-7 score 0.  Stable.  Continue with  coping strategies and tools, will continue to monitor.   Night sweats Assessment & Plan: Has not been worked up previously, starting workup with labs to assess for abnormalities in WBC/RBC/platelets, electrolytes, thyroid, testosterone.  Lab results will guide further workup or management.  Orders: -     CBC with Differential/Platelet; Future -     Comprehensive metabolic panel; Future -     TSH; Future -     T4, free; Future -     Testosterone,Free and Total; Future    Return in about 6 months (around 10/06/2023) for annual physical, fasting blood work 1 week before.    [name], PA

## 2023-04-07 NOTE — Assessment & Plan Note (Signed)
Has not been worked up previously, starting workup with labs to assess for abnormalities in WBC/RBC/platelets, electrolytes, thyroid, testosterone.  Lab results will guide further workup or management.

## 2023-04-07 NOTE — Assessment & Plan Note (Signed)
PHQ-9 score 5 with PHQ 2 score 0, GAD-7 score 0.  Stable.  Continue with coping strategies and tools, will continue to monitor.

## 2023-04-07 NOTE — Patient Instructions (Addendum)
Gastroenterology for colonoscopy: If you will give our office a call at your convenience to discuss scheduling at [phone] option 1   Let me know if they need a new referral!  I will be in touch with you regarding your lab results and whether we need to schedule follow-up sooner than 6 months.

## 2023-04-08 LAB — TESTOSTERONE,FREE AND TOTAL
Testosterone, Free: 9.4 pg/mL (ref 8.7–25.1)
Testosterone: 606 ng/dL (ref 264–916)

## 2023-04-08 LAB — COMPREHENSIVE METABOLIC PANEL
ALT: 18 [IU]/L (ref 0–44)
AST: 18 [IU]/L (ref 0–40)
Albumin: 4.3 g/dL (ref 4.1–5.1)
Alkaline Phosphatase: 61 [IU]/L (ref 44–121)
BUN/Creatinine Ratio: 16 (ref 9–20)
BUN: 11 mg/dL (ref 6–20)
Bilirubin Total: 0.5 mg/dL (ref 0.0–1.2)
CO2: 25 mmol/L (ref 20–29)
Calcium: 9.5 mg/dL (ref 8.7–10.2)
Chloride: 104 mmol/L (ref 96–106)
Creatinine, Ser: 0.7 mg/dL — ABNORMAL LOW (ref 0.76–1.27)
Globulin, Total: 2.4 g/dL (ref 1.5–4.5)
Glucose: 98 mg/dL (ref 70–99)
Potassium: 4.6 mmol/L (ref 3.5–5.2)
Sodium: 143 mmol/L (ref 134–144)
Total Protein: 6.7 g/dL (ref 6.0–8.5)
eGFR: 122 mL/min/{1.73_m2} (ref >59)

## 2023-04-08 LAB — CBC WITH DIFFERENTIAL/PLATELET
Basophils Absolute: 0 10*3/uL (ref 0.0–0.2)
Basos: 1 %
EOS (ABSOLUTE): 0.1 10*3/uL (ref 0.0–0.4)
Eos: 1 %
Hematocrit: 43 % (ref 37.5–51.0)
Hemoglobin: 14.2 g/dL (ref 13.0–17.7)
Immature Grans (Abs): 0 10*3/uL (ref 0.0–0.1)
Immature Granulocytes: 0 %
Lymphocytes Absolute: 1.7 10*3/uL (ref 0.7–3.1)
Lymphs: 29 %
MCH: 30 pg (ref 26.6–33.0)
MCHC: 33 g/dL (ref 31.5–35.7)
MCV: 91 fL (ref 79–97)
Monocytes Absolute: 0.4 10*3/uL (ref 0.1–0.9)
Monocytes: 8 %
Neutrophils Absolute: 3.5 10*3/uL (ref 1.4–7.0)
Neutrophils: 61 %
Platelets: 299 10*3/uL (ref 150–450)
RBC: 4.73 x10E6/uL (ref 4.14–5.80)
RDW: 12.5 % (ref 11.6–15.4)
WBC: 5.7 10*3/uL (ref 3.4–10.8)

## 2023-04-08 LAB — TSH: TSH: 1.05 u[IU]/mL (ref 0.450–4.500)

## 2023-04-08 LAB — T4, FREE: Free T4: 1.46 ng/dL (ref 0.82–1.77)

## 2023-05-12 NOTE — Progress Notes (Deleted)
 Follow Up Note  RE: Philip Todd MRN: [id number] DOB: [birth year] Date of Office Visit: 05/13/2023  Referring provider: Wallace Joesph LABOR, PA Primary care provider: Wallace Joesph LABOR, PA  Chief Complaint: No chief complaint on file.  History of Present Illness: I had the pleasure of seeing Philip Todd for a follow up visit at the [facility] on 05/12/2023. He is a 38 y.o. male, who is being followed for allergic rhinoconjunctivitis, cough, GERD and penicillin allergy . His previous allergy  office visit was on 11/12/2022 with Dr. Luke. Today is a regular follow up visit.  Discussed the use of AI scribe software for clinical note transcription with the patient, who gave verbal consent to proceed.  History of Present Illness            ***  Assessment and Plan: Philip Todd is a 38 y.o. male with: Seasonal and perennial allergic rhinoconjunctivitis Past history - rhinoconjunctivitis symptoms mainly in the spring and fall.  Tried Zyrtec with minimal benefit.   Likes being outdoors. 2024 skin testing positive to grass, ragweed, weed, trees, mold, cat, dog. Interim history - much improved with Flonase  and azelastine  nasal spray. Didn't try Xyzal  or Singulair . Needs refills.  Continue environmental control measures as below. Take Xyzal  (levocetirizine) daily as needed. May take twice a day during allergy  flares. May switch antihistamines every few months. May use Singulair  (montelukast ) 10mg  daily at night as needed.  Cautioned that in some children/adults can experience behavioral changes including hyperactivity, agitation, depression, sleep disturbances and suicidal ideations. These side effects are rare, but if you notice them you should notify me and discontinue Singulair  (montelukast ). Start dymista  (fluticasone  + azelastine  nasal spray combination) 1 spray per nostril twice a day. If this is not covered then go back to using Flonase  and azelastine  separately.  Nasal saline  spray (i.e., Simply Saline) or nasal saline lavage (i.e., NeilMed) is recommended as needed and prior to medicated nasal sprays. Use olopatadine  eye drops 0.2% once a day as needed for itchy/watery eyes.  Consider allergy  injections for long term control if above medications do not help the symptoms.    Chronic cough Past history - ex-smoker. Coughing at times. Reflux improved after quit smoking. 2024 skin testing showed: Positive to grass, ragweed, weed, trees, mold, cat, dog. Negative to common foods. 2024 spirometry showed: normal pattern with 1% improvement in FEV1 post bronchodilator treatment. Clinically feeling unchanged.  Interim history - improved with the nasal sprays.  Monitor symptoms. Based on clinical history and testing results, most likely from PND.  See plan as above for allergic rhinitis.    Gastroesophageal reflux disease Continue lifestyle and dietary modifications. May take omeprazole 20mg  once a day as needed. Nothing to eat or drink for 30 min afterwards.    Penicillin allergy  Past history - patient and parents unsure of exact reaction. Consider penicillin allergy  skin testing and in office drug challenge in the future.  Over 90% of people with history of penicillin allergy  which occurred over 10 years ago are found to be non-allergic.  Assessment and Plan              No follow-ups on file.  No orders of the defined types were placed in this encounter.  Lab Orders  No laboratory test(s) ordered today    Diagnostics: Spirometry:  Tracings reviewed. His effort: {Blank single:19197::Good reproducible efforts.,It was hard to get consistent efforts and there is a question as to whether this reflects a maximal maneuver.,Poor  effort, data can not be interpreted.} FVC: ***L FEV1: ***L, ***% predicted FEV1/FVC ratio: ***% Interpretation: {Blank single:19197::Spirometry consistent with mild obstructive disease,Spirometry consistent with moderate  obstructive disease,Spirometry consistent with severe obstructive disease,Spirometry consistent with possible restrictive disease,Spirometry consistent with mixed obstructive and restrictive disease,Spirometry uninterpretable due to technique,Spirometry consistent with normal pattern,No overt abnormalities noted given today's efforts}.  Please see scanned spirometry results for details.  Skin Testing: {Blank single:19197::Select foods,Environmental allergy  panel,Environmental allergy  panel and select foods,Food allergy  panel,None,Deferred due to recent antihistamines use}. *** Results discussed with patient/family.   Medication List:  Current Outpatient Medications  Medication Sig Dispense Refill  . azelastine  (ASTELIN ) 0.1 % nasal spray Place 1-2 sprays into both nostrils 2 (two) times daily as needed (nasal drainage). Use in each nostril as directed 30 mL 5  . Azelastine -Fluticasone  137-50 MCG/ACT SUSP Place 1 spray into the nose in the morning and at bedtime. 23 g 5  . Azelastine -Fluticasone  137-50 MCG/ACT SUSP Place 1 spray into the nose 2 (two) times daily. 23 g 5  . DYMISTA  137-50 MCG/ACT SUSP Place 2 sprays into both nostrils 1 day or 1 dose. 23 g 5  . fluticasone  (FLONASE ) 50 MCG/ACT nasal spray Place 1 spray into both nostrils 2 (two) times daily as needed (nasal congestion). 16 g 5  . levocetirizine (XYZAL ) 5 MG tablet Take 1 tablet (5 mg total) by mouth every evening. 30 tablet 5  . montelukast  (SINGULAIR ) 10 MG tablet Take 1 tablet (10 mg total) by mouth at bedtime. 30 tablet 5  . Olopatadine  HCl 0.2 % SOLN Apply 1 drop to eye daily as needed (itchy/watery eyes). 2.5 mL 5   No current facility-administered medications for this visit.   Allergies: Allergies  Allergen Reactions  . Penicillins    I reviewed his past medical history, social history, family history, and environmental history and no significant changes have been reported from his previous  visit.  Review of Systems  Constitutional:  Negative for appetite change, chills, fever and unexpected weight change.  HENT:  Positive for congestion, postnasal drip and rhinorrhea. Negative for sneezing.   Eyes:  Negative for itching.  Respiratory:  Positive for cough. Negative for chest tightness, shortness of breath and wheezing.   Cardiovascular:  Negative for chest pain.  Gastrointestinal:  Negative for abdominal pain.  Genitourinary:  Negative for difficulty urinating.  Skin:  Negative for rash.  Allergic/Immunologic: Positive for environmental allergies. Negative for food allergies.  Neurological:  Negative for headaches.   Objective: There were no vitals taken for this visit. There is no height or weight on file to calculate BMI. Physical Exam Vitals and nursing note reviewed.  Constitutional:      Appearance: Normal appearance. He is well-developed.  HENT:     Head: Normocephalic and atraumatic.     Right Ear: Tympanic membrane and external ear normal.     Left Ear: Tympanic membrane and external ear normal.     Nose: Congestion (on right side) present.     Mouth/Throat:     Mouth: Mucous membranes are moist.     Pharynx: Oropharynx is clear.  Eyes:     Conjunctiva/sclera: Conjunctivae normal.  Cardiovascular:     Rate and Rhythm: Normal rate and regular rhythm.     Heart sounds: Normal heart sounds. No murmur heard.    No friction rub. No gallop.  Pulmonary:     Effort: Pulmonary effort is normal.     Breath sounds: Normal breath sounds. No wheezing, rhonchi or  rales.  Musculoskeletal:     Cervical back: Neck supple.  Skin:    General: Skin is warm.     Findings: No rash.  Neurological:     Mental Status: He is alert and oriented to person, place, and time.  Psychiatric:        Behavior: Behavior normal.  Previous notes and tests were reviewed. The plan was reviewed with the patient/family, and all questions/concerned were addressed.  It was my pleasure to  see Philip Todd today and participate in his care. Please feel free to contact me with any questions or concerns.  Sincerely,  Orlan Cramp, DO Allergy  & Immunology  [facility]  [place] office: [phone] [facility] office: [phone]

## 2023-05-13 ENCOUNTER — Ambulatory Visit: Payer: Medicaid Other | Admitting: Allergy

## 2023-06-03 ENCOUNTER — Ambulatory Visit (INDEPENDENT_AMBULATORY_CARE_PROVIDER_SITE_OTHER): Payer: Medicaid Other | Admitting: Allergy

## 2023-06-03 ENCOUNTER — Encounter: Payer: Self-pay | Admitting: Allergy

## 2023-06-03 VITALS — BP 116/86 | HR 85 | Temp 98.0°F | Resp 18 | Wt 174.0 lb

## 2023-06-03 DIAGNOSIS — J3089 Other allergic rhinitis: Secondary | ICD-10-CM | POA: Diagnosis not present

## 2023-06-03 DIAGNOSIS — K219 Gastro-esophageal reflux disease without esophagitis: Secondary | ICD-10-CM | POA: Diagnosis not present

## 2023-06-03 DIAGNOSIS — Z880 Allergy status to penicillin: Secondary | ICD-10-CM

## 2023-06-03 DIAGNOSIS — H1013 Acute atopic conjunctivitis, bilateral: Secondary | ICD-10-CM

## 2023-06-03 DIAGNOSIS — H1010 Acute atopic conjunctivitis, unspecified eye: Secondary | ICD-10-CM

## 2023-06-03 DIAGNOSIS — J302 Other seasonal allergic rhinitis: Secondary | ICD-10-CM

## 2023-06-03 MED ORDER — AZELASTINE-FLUTICASONE 137-50 MCG/ACT NA SUSP: 1.0000 | Freq: Two times a day (BID) | NASAL | 5 refills | Status: AC

## 2023-06-03 NOTE — Progress Notes (Signed)
Follow Up Note  RE: Philip Todd MRN: [id number] DOB: [birth year] Date of Office Visit: 06/03/2023  Referring provider: [name], PA Primary care provider: [name], PA  Chief Complaint: Follow-up  History of Present Illness: I had the pleasure of seeing Philip Todd for a follow up visit at the [facility] on 06/03/2023. He is a 38 y.o. male, who is being followed for allergic rhinoconjunctivitis, cough, GERD and penicillin allergy. His previous allergy office visit was on 11/12/2022 with Dr. Selena Batten. Today is a regular follow up visit.  Discussed the use of AI scribe software for clinical note transcription with the patient, who gave verbal consent to proceed.  He experiences seasonal allergy symptoms primarily in the spring and fall, including congestion and rhinorrhea. His occupation in [employer] occasionally exacerbates these symptoms. He manages his symptoms with nasal sprays as needed, which effectively relieve congestion and rhinorrhea.  He is allergic to pollen, cats, and dogs. He has three dogs and a cat, which now stay in the garage, leading to an improvement in his symptoms, particularly in the mornings.  He has not been taking any regular allergy medications recently but uses nasal sprays as needed. He has not tried the combination nasal spray (Dymista) or montelukast (Singulair) yet, although he has prescriptions for them.   He has a history of heartburn, which has not been bothersome recently. He uses omeprazole as needed, which he finds significantly more effective than previous treatments.     Assessment and Plan: Philip Todd is a 38 y.o. male with: Seasonal and perennial allergic rhinoconjunctivitis Past history - rhinoconjunctivitis symptoms mainly in the spring and fall.  Tried Zyrtec with minimal benefit.   Likes being outdoors. 2024 skin testing positive to grass, ragweed, weed, trees, mold, cat, dog. Interim history - Seasonal symptoms  of congestion and rhinorrhea, exacerbated by exposure to pets and pollen. Currently managed with as-needed use of nasal sprays. Patient declined allergy shots. Continue environmental control measures as below. Take Xyzal (levocetirizine) daily as needed. May take twice a day during allergy flares. May switch antihistamines every few months. May use Singulair (montelukast) 10mg  daily at night as needed.  Cautioned that in some children/adults can experience behavioral changes including hyperactivity, agitation, depression, sleep disturbances and suicidal ideations. These side effects are rare, but if you notice them you should notify me and discontinue Singulair (montelukast). May use dymista (fluticasone + azelastine nasal spray combination) 1 spray per nostril twice a day. If this is not covered then go back to using Flonase and azelastine separately.  Nasal saline spray (i.e., Simply Saline) or nasal saline lavage (i.e., NeilMed) is recommended as needed and prior to medicated nasal sprays. Use olopatadine eye drops 0.2% once a day as needed for itchy/watery eyes.  Consider allergy injections for long term control if above medications do not help the symptoms.   Gastroesophageal reflux disease Previously symptomatic, currently asymptomatic. Positive response to Omeprazole as needed. Continue lifestyle and dietary modifications. May take omeprazole 20mg  once a day as needed. Nothing to eat or drink for 30 min afterwards.    Penicillin allergy Past history - patient and parents unsure of exact reaction. Consider penicillin allergy skin testing and in office drug challenge in the future.  Over 90% of people with history of penicillin allergy which occurred over 10 years ago are found to be non-allergic.   Return in about 6 months (around 12/01/2023).  Meds ordered this encounter  Medications   Azelastine-Fluticasone  137-50 MCG/ACT SUSP    Sig: Place 1 spray into the nose in the morning and at  bedtime.    Dispense:  23 g    Refill:  5   Lab Orders  No laboratory test(s) ordered today   Diagnostics: None.   Medication List:  Current Outpatient Medications  Medication Sig Dispense Refill   azelastine (ASTELIN) 0.1 % nasal spray Place 1-2 sprays into both nostrils 2 (two) times daily as needed (nasal drainage). Use in each nostril as directed 30 mL 5   Azelastine-Fluticasone 137-50 MCG/ACT SUSP Place 1 spray into the nose in the morning and at bedtime. 23 g 5   fluticasone (FLONASE) 50 MCG/ACT nasal spray Place 1 spray into both nostrils 2 (two) times daily as needed (nasal congestion). 16 g 5   levocetirizine (XYZAL) 5 MG tablet Take 1 tablet (5 mg total) by mouth every evening. (Patient not taking: Reported on 06/03/2023) 30 tablet 5   montelukast (SINGULAIR) 10 MG tablet Take 1 tablet (10 mg total) by mouth at bedtime. (Patient not taking: Reported on 06/03/2023) 30 tablet 5   Olopatadine HCl 0.2 % SOLN Apply 1 drop to eye daily as needed (itchy/watery eyes). (Patient not taking: Reported on 06/03/2023) 2.5 mL 5   No current facility-administered medications for this visit.   Allergies: Allergies  Allergen Reactions   Penicillins    I reviewed his past medical history, social history, family history, and environmental history and no significant changes have been reported from his previous visit.  Review of Systems  Constitutional:  Negative for appetite change, chills, fever and unexpected weight change.  HENT:  Negative for congestion, postnasal drip, rhinorrhea and sneezing.   Eyes:  Negative for itching.  Respiratory:  Negative for cough, chest tightness, shortness of breath and wheezing.   Cardiovascular:  Negative for chest pain.  Gastrointestinal:  Negative for abdominal pain.  Genitourinary:  Negative for difficulty urinating.  Skin:  Negative for rash.  Allergic/Immunologic: Positive for environmental allergies. Negative for food allergies.  Neurological:   Negative for headaches.    Objective: BP 116/86   Pulse 85   Temp 98 F (36.7 C)   Resp 18   Wt 174 lb (78.9 kg)   SpO2 99%   BMI 23.11 kg/m  Body mass index is 23.11 kg/m. Physical Exam Vitals and nursing note reviewed.  Constitutional:      Appearance: Normal appearance. He is well-developed.  HENT:     Head: Normocephalic and atraumatic.     Right Ear: Tympanic membrane and external ear normal.     Left Ear: Tympanic membrane and external ear normal.     Nose: Nose normal.     Mouth/Throat:     Mouth: Mucous membranes are moist.     Pharynx: Oropharynx is clear.  Eyes:     Conjunctiva/sclera: Conjunctivae normal.  Cardiovascular:     Rate and Rhythm: Normal rate and regular rhythm.     Heart sounds: Normal heart sounds. No murmur heard.    No friction rub. No gallop.  Pulmonary:     Effort: Pulmonary effort is normal.     Breath sounds: Normal breath sounds. No wheezing, rhonchi or rales.  Musculoskeletal:     Cervical back: Neck supple.  Skin:    General: Skin is warm.     Findings: No rash.  Neurological:     Mental Status: He is alert and oriented to person, place, and time.  Psychiatric:  Behavior: Behavior normal.   Previous notes and tests were reviewed. The plan was reviewed with the patient/family, and all questions/concerned were addressed.  It was my pleasure to see Philip Todd today and participate in his care. Please feel free to contact me with any questions or concerns.  Sincerely,  Wyline Mood, DO Allergy & Immunology  [facility] office: [phone] [facility] office: [phone]

## 2023-06-03 NOTE — Patient Instructions (Addendum)
Environmental allergies 2024 skin testing showed: Positive to grass, ragweed, weed, trees, mold, cat, dog. Continue environmental control measures as below. Take Xyzal (levocetirizine) daily as needed. May take twice a day during allergy flares. May switch antihistamines every few months. May use Singulair (montelukast) 10mg  daily at night as needed.  Cautioned that in some children/adults can experience behavioral changes including hyperactivity, agitation, depression, sleep disturbances and suicidal ideations. These side effects are rare, but if you notice them you should notify me and discontinue Singulair (montelukast). May use dymista (fluticasone + azelastine nasal spray combination) 1 spray per nostril twice a day. If this is not covered then go back to using Flonase and azelastine separately.  Nasal saline spray (i.e., Simply Saline) or nasal saline lavage (i.e., NeilMed) is recommended as needed and prior to medicated nasal sprays. Use olopatadine eye drops 0.2% once a day as needed for itchy/watery eyes.  Consider allergy injections for long term control if above medications do not help the symptoms.  Heartburn Continue lifestyle and dietary modifications. May take omeprazole 20mg  once a day as needed. Nothing to eat or drink for 30 min afterwards.   Penicillin allergy: Consider penicillin allergy skin testing and in office drug challenge in the future.  Over 90% of people with history of penicillin allergy which occurred over 10 years ago are found to be non-allergic.   Follow up in 6 months or sooner if needed.    Reducing Pollen Exposure Pollen seasons: trees (spring), grass (summer) and ragweed/weeds (fall). Keep windows closed in your home and car to lower pollen exposure.  Install air conditioning in the bedroom and throughout the house if possible.  Avoid going out in dry windy days - especially early morning. Pollen counts are highest between 5 - 10 AM and on dry, hot and  windy days.  Save outside activities for late afternoon or after a heavy rain, when pollen levels are lower.  Avoid mowing of grass if you have grass pollen allergy. Be aware that pollen can also be transported indoors on people and pets.  Dry your clothes in an automatic dryer rather than hanging them outside where they might collect pollen.  Rinse hair and eyes before bedtime. Control of House Dust Mite Allergen Dust mite allergens are a common trigger of allergy and asthma symptoms. While they can be found throughout the house, these microscopic creatures thrive in warm, humid environments such as bedding, upholstered furniture and carpeting. Because so much time is spent in the bedroom, it is essential to reduce mite levels there.  Encase pillows, mattresses, and box springs in special allergen-proof fabric covers or airtight, zippered plastic covers.  Bedding should be washed weekly in hot water (130 F) and dried in a hot dryer. Allergen-proof covers are available for comforters and pillows that can't be regularly washed.  Wash the allergy-proof covers every few months. Minimize clutter in the bedroom. Keep pets out of the bedroom.  Keep humidity less than 50% by using a dehumidifier or air conditioning. You can buy a humidity measuring device called a hygrometer to monitor this.  If possible, replace carpets with hardwood, linoleum, or washable area rugs. If that's not possible, vacuum frequently with a vacuum that has a HEPA filter. Remove all upholstered furniture and non-washable window drapes from the bedroom. Remove all non-washable stuffed toys from the bedroom.  Wash stuffed toys weekly. Pet Allergen Avoidance: Contrary to popular opinion, there are no "hypoallergenic" breeds of dogs or cats. That is because people are  not allergic to an animal's hair, but to an allergen found in the animal's saliva, dander (dead skin flakes) or urine. Pet allergy symptoms typically occur within  minutes. For some people, symptoms can build up and become most severe 8 to 12 hours after contact with the animal. People with severe allergies can experience reactions in public places if dander has been transported on the pet owners' clothing. Keeping an animal outdoors is only a partial solution, since homes with pets in the yard still have higher concentrations of animal allergens. Before getting a pet, ask your allergist to determine if you are allergic to animals. If your pet is already considered part of your family, try to minimize contact and keep the pet out of the bedroom and other rooms where you spend a great deal of time. As with dust mites, vacuum carpets often or replace carpet with a hardwood floor, tile or linoleum. High-efficiency particulate air (HEPA) cleaners can reduce allergen levels over time. While dander and saliva are the source of cat and dog allergens, urine is the source of allergens from rabbits, hamsters, mice and [place] pigs; so ask a non-allergic family member to clean the animal's cage. If you have a pet allergy, talk to your allergist about the potential for allergy immunotherapy (allergy shots). This strategy can often provide long-term relief. Mold Control Mold and fungi can grow on a variety of surfaces provided certain temperature and moisture conditions exist.  Outdoor molds grow on plants, decaying vegetation and soil. The major outdoor mold, Alternaria and Cladosporium, are found in very high numbers during hot and dry conditions. Generally, a late summer - fall peak is seen for common outdoor fungal spores. Rain will temporarily lower outdoor mold spore count, but counts rise rapidly when the rainy period ends. The most important indoor molds are Aspergillus and Penicillium. Dark, humid and poorly ventilated basements are ideal sites for mold growth. The next most common sites of mold growth are the bathroom and the kitchen. Outdoor (Seasonal) Mold  Control Use air conditioning and keep windows closed. Avoid exposure to decaying vegetation. Avoid leaf raking. Avoid grain handling. Consider wearing a face mask if working in moldy areas.  Indoor (Perennial) Mold Control  Maintain humidity below 50%. Get rid of mold growth on hard surfaces with water, detergent and, if necessary, 5% bleach (do not mix with other cleaners). Then dry the area completely. If mold covers an area more than 10 square feet, consider hiring an indoor environmental professional. For clothing, washing with soap and water is best. If moldy items cannot be cleaned and dried, throw them away. Remove sources e.g. contaminated carpets. Repair and seal leaking roofs or pipes. Using dehumidifiers in damp basements may be helpful, but empty the water and clean units regularly to prevent mildew from forming. All rooms, especially basements, bathrooms and kitchens, require ventilation and cleaning to deter mold and mildew growth. Avoid carpeting on concrete or damp floors, and storing items in damp areas.

## 2023-06-12 ENCOUNTER — Encounter: Payer: Self-pay | Admitting: Family Medicine

## 2023-09-08 ENCOUNTER — Ambulatory Visit: Payer: Self-pay

## 2023-09-08 NOTE — Telephone Encounter (Signed)
 Chief Complaint: Cough Symptoms: nasal congestion, sore throat, r ear pain Frequency: x 2 weeks Pertinent Negatives: Patient denies CP, wheezing Disposition: [] ED /[] Urgent Care (no appt availability in office) / [x] Appointment(In office/virtual)/ []  [facility]/ [] Home Care/ [] Refused Recommended Disposition /[] [facility]/ []  Follow-up with PCP Additional Notes: Pt reports he has been experiencing productive cough, nasal congestion, SOB with exertion, sore throat and r ear pain x 2 weeks. OV scheduled. This RN educated pt on home care, new-worsening symptoms, when to call back/seek emergent care. Pt verbalized understanding and agrees to plan.     Copied From CRM [id number]. Reason for Triage: Patient experiencing really loud and hard coughing really bad throat pain and also ear pain any time he swallows this is gotten much worse few day temperature is 100.1 Patient call back number [phone] (M)     Reason for Disposition  Earache  Answer Assessment - Initial Assessment Questions 1. ONSET: "When did the cough begin?"      X 2 weeks  3. SPUTUM: "Describe the color of your sputum" (none, dry cough; clear, white, yellow, green)     Green 4. HEMOPTYSIS: "Are you coughing up any blood?" If so ask: "How much?" (flecks, streaks, tablespoons, etc.)     None 5. DIFFICULTY BREATHING: "Are you having difficulty breathing?" If Yes, ask: "How bad is it?" (e.g., mild, moderate, severe)    - MILD: No SOB at rest, mild SOB with walking, speaks normally in sentences, can lie down, no retractions, pulse < 100.    - MODERATE: SOB at rest, SOB with minimal exertion and prefers to sit, cannot lie down flat, speaks in phrases, mild retractions, audible wheezing, pulse 100-120.    - SEVERE: Very SOB at rest, speaks in single words, struggling to breathe, sitting hunched forward, retractions, pulse > 120      Mild SOB with exertion 6. FEVER: "Do you have a fever?" If Yes, ask: "What  is your temperature, how was it measured, and when did it start?"     100.0 10. OTHER SYMPTOMS: "Do you have any other symptoms?" (e.g., runny nose, wheezing, chest pain)       Nasal congestion, PND, sore throat  Protocols used: Cough - Acute Productive-A-AH

## 2023-09-09 ENCOUNTER — Ambulatory Visit: Admitting: Family Medicine

## 2023-09-30 ENCOUNTER — Other Ambulatory Visit: Payer: Self-pay | Admitting: *Deleted

## 2023-09-30 DIAGNOSIS — K219 Gastro-esophageal reflux disease without esophagitis: Secondary | ICD-10-CM

## 2023-09-30 DIAGNOSIS — Z130 Encounter for screening for diseases of the blood and blood-forming organs and certain disorders involving the immune mechanism: Secondary | ICD-10-CM

## 2023-09-30 DIAGNOSIS — F39 Unspecified mood [affective] disorder: Secondary | ICD-10-CM

## 2023-09-30 DIAGNOSIS — E559 Vitamin D deficiency, unspecified: Secondary | ICD-10-CM

## 2023-10-01 ENCOUNTER — Other Ambulatory Visit: Payer: Medicaid Other

## 2023-10-01 DIAGNOSIS — E559 Vitamin D deficiency, unspecified: Secondary | ICD-10-CM

## 2023-10-01 DIAGNOSIS — Z130 Encounter for screening for diseases of the blood and blood-forming organs and certain disorders involving the immune mechanism: Secondary | ICD-10-CM

## 2023-10-02 ENCOUNTER — Ambulatory Visit: Payer: Self-pay | Admitting: Family Medicine

## 2023-10-02 LAB — CBC WITH DIFFERENTIAL/PLATELET
Basophils Absolute: 0 10*3/uL (ref 0.0–0.2)
Basos: 1 %
EOS (ABSOLUTE): 0.1 10*3/uL (ref 0.0–0.4)
Eos: 2 %
Hematocrit: 48.4 % (ref 37.5–51.0)
Hemoglobin: 15.6 g/dL (ref 13.0–17.7)
Immature Grans (Abs): 0 10*3/uL (ref 0.0–0.1)
Immature Granulocytes: 0 %
Lymphocytes Absolute: 2.2 10*3/uL (ref 0.7–3.1)
Lymphs: 36 %
MCH: 30.2 pg (ref 26.6–33.0)
MCHC: 32.2 g/dL (ref 31.5–35.7)
MCV: 94 fL (ref 79–97)
Monocytes Absolute: 0.4 10*3/uL (ref 0.1–0.9)
Monocytes: 7 %
Neutrophils Absolute: 3.4 10*3/uL (ref 1.4–7.0)
Neutrophils: 54 %
Platelets: 296 10*3/uL (ref 150–450)
RBC: 5.17 x10E6/uL (ref 4.14–5.80)
RDW: 13 % (ref 11.6–15.4)
WBC: 6.1 10*3/uL (ref 3.4–10.8)

## 2023-10-02 LAB — COMPREHENSIVE METABOLIC PANEL WITH GFR
ALT: 18 IU/L (ref 0–44)
AST: 17 IU/L (ref 0–40)
Albumin: 4.5 g/dL (ref 4.1–5.1)
Alkaline Phosphatase: 69 IU/L (ref 44–121)
BUN/Creatinine Ratio: 20 (ref 9–20)
BUN: 18 mg/dL (ref 6–20)
Bilirubin Total: <0.2 mg/dL (ref 0.0–1.2)
CO2: 22 mmol/L (ref 20–29)
Calcium: 9.6 mg/dL (ref 8.7–10.2)
Chloride: 102 mmol/L (ref 96–106)
Creatinine, Ser: 0.91 mg/dL (ref 0.76–1.27)
Globulin, Total: 2.6 g/dL (ref 1.5–4.5)
Glucose: 101 mg/dL — ABNORMAL HIGH (ref 70–99)
Potassium: 4.6 mmol/L (ref 3.5–5.2)
Sodium: 141 mmol/L (ref 134–144)
Total Protein: 7.1 g/dL (ref 6.0–8.5)
eGFR: 111 mL/min/{1.73_m2} (ref >59)

## 2023-10-02 LAB — HEMOGLOBIN A1C
Est. average glucose Bld gHb Est-mCnc: 105 mg/dL
Hgb A1c MFr Bld: 5.3 % (ref 4.8–5.6)

## 2023-10-02 LAB — VITAMIN D 25 HYDROXY (VIT D DEFICIENCY, FRACTURES): Vit D, 25-Hydroxy: 30.9 ng/mL (ref 30.0–100.0)

## 2023-10-02 LAB — LIPID PANEL
Chol/HDL Ratio: 3.1 ratio (ref 0.0–5.0)
Cholesterol, Total: 174 mg/dL (ref 100–199)
HDL: 57 mg/dL (ref >39)
LDL Chol Calc (NIH): 88 mg/dL (ref 0–99)
Triglycerides: 167 mg/dL — ABNORMAL HIGH (ref 0–149)
VLDL Cholesterol Cal: 29 mg/dL (ref 5–40)

## 2023-10-02 LAB — TSH: TSH: 2.81 u[IU]/mL (ref 0.450–4.500)

## 2023-10-03 NOTE — Progress Notes (Addendum)
 Complete physical exam  Patient: Philip Todd   DOB: Feb 20, 1986   38 y.o. Male  MRN: [id number]  Subjective:     Chief Complaint  Patient presents with   Annual Exam    Physical    Philip Todd is a 38 y.o. male who presents today for a complete physical exam. He reports consuming a general diet. The patient has a physically strenuous job, but has no regular exercise apart from work.  He works as an Personnel officer. He generally feels well. He reports sleeping well. He does have additional problems to discuss today.   Currently not taking any medications.  He reports that he is sleeping not great but believes this is due to having an 45 month old daughter who does not always sleep through the night.  Reports that his mood stable. Denies SI/HI.   He reports that he is not currently smoking; recently quit in 2023.  Denies ETOH or other drug use   Is UTD on health screenings appropriate for his age. Due to immediate family history of colon cancer in his grandfather and polyps in his mother and sister, he will need a referral for colonscopy.Denies changes in stool, blood in stool, stomach/rectal pain, etc.   Pt also reports that he hurt his back a few weeks ago at trampoline park. Reports the pain is in the midline of his back and worse with twisting and overhead movements, which he does a lot due to his job as an Personnel officer. States the pain is sharp in nature. He has been taking Ibuprofen inconsistently for the pain which he reports does help when he takes it.  Most recent fall risk assessment:    10/06/2023    8:21 AM  Fall Risk   Falls in the past year? 0  Number falls in past yr: 0  Injury with Fall? 0  Risk for fall due to : No Fall Risks  Follow up Falls evaluation completed     Most recent depression screenings:    10/06/2023    8:21 AM 04/07/2023   10:11 AM  PHQ 2/9 Scores  PHQ - 2 Score 0 0  PHQ- 9 Score 4 5        Patient Care Team: Melene Sportsman as  PCP - General (Physician Assistant)   Outpatient Medications Prior to Visit  Medication Sig   azelastine  (ASTELIN ) 0.1 % nasal spray Place 1-2 sprays into both nostrils 2 (two) times daily as needed (nasal drainage). Use in each nostril as directed (Patient not taking: Reported on 10/06/2023)   Azelastine -Fluticasone  137-50 MCG/ACT SUSP Place 1 spray into the nose in the morning and at bedtime. (Patient not taking: Reported on 10/06/2023)   fluticasone  (FLONASE ) 50 MCG/ACT nasal spray Place 1 spray into both nostrils 2 (two) times daily as needed (nasal congestion). (Patient not taking: Reported on 10/06/2023)   levocetirizine (XYZAL ) 5 MG tablet Take 1 tablet (5 mg total) by mouth every evening. (Patient not taking: Reported on 10/06/2023)   montelukast  (SINGULAIR ) 10 MG tablet Take 1 tablet (10 mg total) by mouth at bedtime. (Patient not taking: Reported on 10/06/2023)   Olopatadine  HCl 0.2 % SOLN Apply 1 drop to eye daily as needed (itchy/watery eyes). (Patient not taking: Reported on 10/06/2023)   No facility-administered medications prior to visit.    ROS  As noted in HPI      Objective:     BP 112/70   Pulse 70   Ht 6'  0.75" (1.848 m)   SpO2 100%   BMI 23.11 kg/m    Physical Exam Constitutional:      General: He is not in acute distress.    Appearance: Normal appearance.  HENT:     Mouth/Throat:     Mouth: Mucous membranes are moist.     Pharynx: Posterior oropharyngeal erythema (Cobblestoning and PND present) present.  Cardiovascular:     Rate and Rhythm: Normal rate and regular rhythm.     Heart sounds: Normal heart sounds. No murmur heard.    No friction rub. No gallop.  Pulmonary:     Effort: Pulmonary effort is normal. No respiratory distress.     Breath sounds: Normal breath sounds.  Musculoskeletal:        General: No swelling.     Cervical back: No deformity (posture related kyphosis).     Thoracic back: Tenderness present. No deformity or signs of trauma. Normal  range of motion.  Skin:    General: Skin is warm and dry.  Neurological:     General: No focal deficit present.     Mental Status: He is alert.  Psychiatric:        Mood and Affect: Mood normal.        Behavior: Behavior normal.        Thought Content: Thought content normal.      No results found for any visits on 10/06/23. Last CBC Lab Results  Component Value Date   WBC 6.1 10/01/2023   HGB 15.6 10/01/2023   HCT 48.4 10/01/2023   MCV 94 10/01/2023   MCH 30.2 10/01/2023   RDW 13.0 10/01/2023   PLT 296 10/01/2023   Last metabolic panel Lab Results  Component Value Date   GLUCOSE 101 (H) 10/01/2023   NA 141 10/01/2023   K 4.6 10/01/2023   CL 102 10/01/2023   CO2 22 10/01/2023   BUN 18 10/01/2023   CREATININE 0.91 10/01/2023   EGFR 111 10/01/2023   CALCIUM 9.6 10/01/2023   PROT 7.1 10/01/2023   ALBUMIN 4.5 10/01/2023   LABGLOB 2.6 10/01/2023   AGRATIO 1.9 08/07/2022   BILITOT <0.2 10/01/2023   ALKPHOS 69 10/01/2023   AST 17 10/01/2023   ALT 18 10/01/2023   Last lipids Lab Results  Component Value Date   CHOL 174 10/01/2023   HDL 57 10/01/2023   LDLCALC 88 10/01/2023   TRIG 167 (H) 10/01/2023   CHOLHDL 3.1 10/01/2023   Last hemoglobin A1c Lab Results  Component Value Date   HGBA1C 5.3 10/01/2023   Last thyroid functions Lab Results  Component Value Date   TSH 2.810 10/01/2023   Last vitamin D  Lab Results  Component Value Date   VD25OH 30.9 10/01/2023        Assessment & Plan:    Routine Health Maintenance and Physical Exam  Immunization History  Administered Date(s) Administered   Influenza Inj Mdck Quad Pf 03/04/2018   Td 06/19/2017    Health Maintenance  Topic Date Due   COVID-19 Vaccine (1 - 2024-25 season) Never done   DTaP/Tdap/Td (2 - Tdap) 06/20/2027   Hepatitis C Screening  Completed   HIV Screening  Completed   HPV VACCINES  Aged Out   Meningococcal B Vaccine  Aged Out   INFLUENZA VACCINE  Discontinued    Discussed  health benefits of physical activity, and encouraged him to engage in regular exercise appropriate for his age and condition.  Problem List Items Addressed This Visit  Endocrine   Impaired fasting glucose (Chronic)   Fasting glucose 101. A1c WNL. Advised patient on the importance of limiting sugar intake through processed foods and full-sugar sodas. Will recheck again in 1 year.        Other   Encounter for annual general medical examination with abnormal findings in adult - Primary (Chronic)   Went over lab results in person with patient. Discussed age-appropriate screenings (colonscopy) and vaccines. Will plan to repeat physical in 1 year with fasting labs 1 wk before.      Family history of colon cancer requiring screening colonoscopy (Chronic)   Given family history of colon cancer in grandfather (diagnosed before age 24) and polyps found early in mother and sister, patient requires early screening for colon cancer. Referral to GI for screening colonoscopy placed today.      Relevant Orders   Ambulatory referral to Gastroenterology   Hyperlipidemia (Chronic)   Last lipid panel: LDL 88, HDL 57, Trig 167. LDL improved to WNL from last visit. Trig have worsened. Advised patient to avoid foods high in saturated/trans fat.  The ASCVD Risk score (Arnett DK, et al., 2019) failed to calculate for the following reasons:   The 2019 ASCVD risk score is only valid for ages 59 to 26  Will plan to recheck lipid panel in 1 year and add medications as indicated.      Mid-back pain, acute   Injury at trampoline park a few weeks ago. Not improving with home remedies. Rx Celebrex 100 mg BID with food for mid back pain. Advised patient not to combine this medication with other NSAIDs. Pt not interested in PT at this time - would like to see if it improves with anti-inflammatories alone. Instructed patient to call office or send MyChart message in 8 weeks if his back pain is worsened or not  improved. If not improved, consider x-ray of T-spine and/or referral to PT.       Relevant Medications   celecoxib (CELEBREX) 100 MG capsule   Poor sleep   Pt not currently taking anything for sleep. Waking up a lot in the night due to having 44 month old daughter. Recommend 200 mg magnesium glycinate OTC for sleep support. Pt advised that if sleep does not improve or if he becomes interested in trying prescribed medications to sleep to schedule a follow-up appointment. Will continue to monitor.      Other Visit Diagnoses       Acute midline thoracic back pain       Relevant Medications   celecoxib (CELEBREX) 100 MG capsule      Return in about 1 year (around 10/05/2024) for Physical.     Odilia Bennett, PA-C

## 2023-10-06 ENCOUNTER — Ambulatory Visit (INDEPENDENT_AMBULATORY_CARE_PROVIDER_SITE_OTHER)

## 2023-10-06 ENCOUNTER — Encounter: Payer: Medicaid Other | Admitting: Family Medicine

## 2023-10-06 VITALS — BP 112/70 | HR 70 | Ht 72.75 in

## 2023-10-06 DIAGNOSIS — Z800 Family history of malignant neoplasm of digestive organs: Secondary | ICD-10-CM | POA: Diagnosis not present

## 2023-10-06 DIAGNOSIS — Z0001 Encounter for general adult medical examination with abnormal findings: Secondary | ICD-10-CM | POA: Insufficient documentation

## 2023-10-06 DIAGNOSIS — Z72820 Sleep deprivation: Secondary | ICD-10-CM | POA: Insufficient documentation

## 2023-10-06 DIAGNOSIS — E782 Mixed hyperlipidemia: Secondary | ICD-10-CM

## 2023-10-06 DIAGNOSIS — M546 Pain in thoracic spine: Secondary | ICD-10-CM

## 2023-10-06 DIAGNOSIS — E785 Hyperlipidemia, unspecified: Secondary | ICD-10-CM | POA: Insufficient documentation

## 2023-10-06 DIAGNOSIS — R7301 Impaired fasting glucose: Secondary | ICD-10-CM | POA: Insufficient documentation

## 2023-10-06 DIAGNOSIS — M549 Dorsalgia, unspecified: Secondary | ICD-10-CM | POA: Insufficient documentation

## 2023-10-06 MED ORDER — CELECOXIB 100 MG PO CAPS: 100.0000 mg | ORAL_CAPSULE | Freq: Two times a day (BID) | ORAL | 2 refills | Status: DC

## 2023-10-06 NOTE — Patient Instructions (Signed)
 It was nice to see you today!  As we discussed in clinic:   -Your blood work overall looked great. We will continue to monitor your cholesterol yearly and treat as indicated.  -I am placing your referral to [facility] for colonscopy. They will call you to schedule this.  -I am also sending in Celebrex 100 mg for you to take twice per day with food for your back pain. If the pain is not improving or is worse in 8 weeks, call the office or send me a MyChart message so we can look into exploring and treating the pain further. Remember not to combine the Celebrex with other NSAIDs (Ibuprofen, aleve, etc.) -I will plan to see you back for your physical in 1 year, or sooner if you need us  in between! It was nice to meet you!  If you have any problems before your next visit feel free to message me via MyChart (minor issues or questions) or call the office, otherwise you may reach out to schedule an office visit.  Thank you! Meryl Acosta, PA-C

## 2023-10-06 NOTE — Assessment & Plan Note (Signed)
 Fasting glucose 101. A1c WNL. Advised patient on the importance of limiting sugar intake through processed foods and full-sugar sodas. Will recheck again in 1 year.

## 2023-10-06 NOTE — Assessment & Plan Note (Signed)
 Last lipid panel: LDL 88, HDL 57, Trig 167. LDL improved to WNL from last visit. Trig have worsened. Advised patient to avoid foods high in saturated/trans fat.  The ASCVD Risk score (Arnett DK, et al., 2019) failed to calculate for the following reasons:   The 2019 ASCVD risk score is only valid for ages 82 to 75  Will plan to recheck lipid panel in 1 year and add medications as indicated.

## 2023-10-06 NOTE — Assessment & Plan Note (Signed)
 Went over lab results in person with patient. Discussed age-appropriate screenings (colonscopy) and vaccines. Will plan to repeat physical in 1 year with fasting labs 1 wk before.

## 2023-10-06 NOTE — Assessment & Plan Note (Signed)
 Pt not currently taking anything for sleep. Waking up a lot in the night due to having 49 month old daughter. Recommend 200 mg magnesium glycinate OTC for sleep support. Pt advised that if sleep does not improve or if he becomes interested in trying prescribed medications to sleep to schedule a follow-up appointment. Will continue to monitor.

## 2023-10-06 NOTE — Assessment & Plan Note (Signed)
 Injury at trampoline park a few weeks ago. Not improving with home remedies. Rx Celebrex 100 mg BID with food for mid back pain. Advised patient not to combine this medication with other NSAIDs. Pt not interested in PT at this time - would like to see if it improves with anti-inflammatories alone. Instructed patient to call office or send MyChart message in 8 weeks if his back pain is worsened or not improved. If not improved, consider x-ray of T-spine and/or referral to PT.

## 2023-10-06 NOTE — Assessment & Plan Note (Signed)
 Given family history of colon cancer in grandfather (diagnosed before age 37) and polyps found early in mother and sister, patient requires early screening for colon cancer. Referral to GI for screening colonoscopy placed today.

## 2023-10-24 ENCOUNTER — Encounter: Payer: Self-pay | Admitting: Physician Assistant

## 2023-12-01 NOTE — Progress Notes (Deleted)
 Follow Up Note  RE: Philip Todd MRN: [id number] DOB: [birth year] Date of Office Visit: 12/02/2023  Referring provider: Wallace Joesph LABOR, PA Primary care provider: [name], PA-C  Chief Complaint: No chief complaint on file.  History of Present Illness: I had the pleasure of seeing Philip Todd for a follow up visit at the [facility] on 12/02/2023. He is a 38 y.o. male, who is being followed for allergic rhinoconjunctivitis, GERD and penicillin allergy . His previous allergy  office visit was on 06/03/2023 with Dr. Luke. Today is a regular follow up visit.  Discussed the use of AI scribe software for clinical note transcription with the patient, who gave verbal consent to proceed.  History of Present Illness            ***  Assessment and Plan: Philip Todd is a 38 y.o. male with: Seasonal and perennial allergic rhinoconjunctivitis Past history - rhinoconjunctivitis symptoms mainly in the spring and fall.  Tried Zyrtec with minimal benefit.   Likes being outdoors. 2024 skin testing positive to grass, ragweed, weed, trees, mold, cat, dog. Interim history - Seasonal symptoms of congestion and rhinorrhea, exacerbated by exposure to pets and pollen. Currently managed with as-needed use of nasal sprays. Patient declined allergy  shots. Continue environmental control measures as below. Take Xyzal  (levocetirizine) daily as needed. May take twice a day during allergy  flares. May switch antihistamines every few months. May use Singulair  (montelukast ) 10mg  daily at night as needed.  Cautioned that in some children/adults can experience behavioral changes including hyperactivity, agitation, depression, sleep disturbances and suicidal ideations. These side effects are rare, but if you notice them you should notify me and discontinue Singulair  (montelukast ). May use dymista  (fluticasone  + azelastine  nasal spray combination) 1 spray per nostril twice a day. If this is not covered  then go back to using Flonase  and azelastine  separately.  Nasal saline spray (i.e., Simply Saline) or nasal saline lavage (i.e., NeilMed) is recommended as needed and prior to medicated nasal sprays. Use olopatadine  eye drops 0.2% once a day as needed for itchy/watery eyes.  Consider allergy  injections for long term control if above medications do not help the symptoms.   Gastroesophageal reflux disease Previously symptomatic, currently asymptomatic. Positive response to Omeprazole as needed. Continue lifestyle and dietary modifications. May take omeprazole 20mg  once a day as needed. Nothing to eat or drink for 30 min afterwards.    Penicillin allergy  Past history - patient and parents unsure of exact reaction. Consider penicillin allergy  skin testing and in office drug challenge in the future.  Over 90% of people with history of penicillin allergy  which occurred over 10 years ago are found to be non-allergic.  Assessment and Plan              No follow-ups on file.  No orders of the defined types were placed in this encounter.  Lab Orders  No laboratory test(s) ordered today    Diagnostics: Spirometry:  Tracings reviewed. His effort: {Blank single:19197::Good reproducible efforts.,It was hard to get consistent efforts and there is a question as to whether this reflects a maximal maneuver.,Poor effort, data can not be interpreted.} FVC: ***L FEV1: ***L, ***% predicted FEV1/FVC ratio: ***% Interpretation: {Blank single:19197::Spirometry consistent with mild obstructive disease,Spirometry consistent with moderate obstructive disease,Spirometry consistent with severe obstructive disease,Spirometry consistent with possible restrictive disease,Spirometry consistent with mixed obstructive and restrictive disease,Spirometry uninterpretable due to technique,Spirometry consistent with normal pattern,No overt abnormalities noted given today's efforts}.  Please see  scanned spirometry results for details.  Skin Testing: {Blank single:19197::Select foods,Environmental allergy  panel,Environmental allergy  panel and select foods,Food allergy  panel,None,Deferred due to recent antihistamines use}. *** Results discussed with patient/family.   Medication List:  Current Outpatient Medications  Medication Sig Dispense Refill  . azelastine  (ASTELIN ) 0.1 % nasal spray Place 1-2 sprays into both nostrils 2 (two) times daily as needed (nasal drainage). Use in each nostril as directed (Patient not taking: Reported on 10/06/2023) 30 mL 5  . Azelastine -Fluticasone  137-50 MCG/ACT SUSP Place 1 spray into the nose in the morning and at bedtime. (Patient not taking: Reported on 10/06/2023) 23 g 5  . celecoxib  (CELEBREX ) 100 MG capsule Take 1 capsule (100 mg total) by mouth 2 (two) times daily. 60 capsule 2  . fluticasone  (FLONASE ) 50 MCG/ACT nasal spray Place 1 spray into both nostrils 2 (two) times daily as needed (nasal congestion). (Patient not taking: Reported on 10/06/2023) 16 g 5  . levocetirizine (XYZAL ) 5 MG tablet Take 1 tablet (5 mg total) by mouth every evening. (Patient not taking: Reported on 10/06/2023) 30 tablet 5  . montelukast  (SINGULAIR ) 10 MG tablet Take 1 tablet (10 mg total) by mouth at bedtime. (Patient not taking: Reported on 10/06/2023) 30 tablet 5  . Olopatadine  HCl 0.2 % SOLN Apply 1 drop to eye daily as needed (itchy/watery eyes). (Patient not taking: Reported on 10/06/2023) 2.5 mL 5   No current facility-administered medications for this visit.   Allergies: Allergies  Allergen Reactions  . Penicillins    I reviewed his past medical history, social history, family history, and environmental history and no significant changes have been reported from his previous visit.  Review of Systems  Constitutional:  Negative for appetite change, chills, fever and unexpected weight change.  HENT:  Negative for congestion, postnasal drip, rhinorrhea and  sneezing.   Eyes:  Negative for itching.  Respiratory:  Negative for cough, chest tightness, shortness of breath and wheezing.   Cardiovascular:  Negative for chest pain.  Gastrointestinal:  Negative for abdominal pain.  Genitourinary:  Negative for difficulty urinating.  Skin:  Negative for rash.  Allergic/Immunologic: Positive for environmental allergies. Negative for food allergies.  Neurological:  Negative for headaches.    Objective: There were no vitals taken for this visit. There is no height or weight on file to calculate BMI. Physical Exam Vitals and nursing note reviewed.  Constitutional:      Appearance: Normal appearance. He is well-developed.  HENT:     Head: Normocephalic and atraumatic.     Right Ear: Tympanic membrane and external ear normal.     Left Ear: Tympanic membrane and external ear normal.     Nose: Nose normal.     Mouth/Throat:     Mouth: Mucous membranes are moist.     Pharynx: Oropharynx is clear.  Eyes:     Conjunctiva/sclera: Conjunctivae normal.  Cardiovascular:     Rate and Rhythm: Normal rate and regular rhythm.     Heart sounds: Normal heart sounds. No murmur heard.    No friction rub. No gallop.  Pulmonary:     Effort: Pulmonary effort is normal.     Breath sounds: Normal breath sounds. No wheezing, rhonchi or rales.  Musculoskeletal:     Cervical back: Neck supple.  Skin:    General: Skin is warm.     Findings: No rash.  Neurological:     Mental Status: He is alert and oriented to person, place, and time.  Psychiatric:  Behavior: Behavior normal.   Previous notes and tests were reviewed. The plan was reviewed with the patient/family, and all questions/concerned were addressed.  It was my pleasure to see Philip Todd today and participate in his care. Please feel free to contact me with any questions or concerns.  Sincerely,  Orlan Cramp, DO Allergy  & Immunology  [facility]  [place] office:  [phone] [facility] office: [phone]

## 2023-12-02 ENCOUNTER — Ambulatory Visit: Payer: Medicaid Other | Admitting: Allergy

## 2023-12-02 DIAGNOSIS — Z880 Allergy status to penicillin: Secondary | ICD-10-CM

## 2023-12-02 DIAGNOSIS — K219 Gastro-esophageal reflux disease without esophagitis: Secondary | ICD-10-CM

## 2023-12-02 DIAGNOSIS — H1010 Acute atopic conjunctivitis, unspecified eye: Secondary | ICD-10-CM

## 2023-12-18 ENCOUNTER — Ambulatory Visit: Admitting: Physician Assistant

## 2024-01-28 ENCOUNTER — Ambulatory Visit: Admitting: Gastroenterology

## 2024-03-19 ENCOUNTER — Ambulatory Visit: Admitting: Gastroenterology

## 2024-03-19 ENCOUNTER — Encounter: Payer: Self-pay | Admitting: Gastroenterology

## 2024-03-19 VITALS — BP 118/88 | HR 76 | Ht 72.0 in | Wt 167.1 lb

## 2024-03-19 DIAGNOSIS — K625 Hemorrhage of anus and rectum: Secondary | ICD-10-CM | POA: Diagnosis not present

## 2024-03-19 DIAGNOSIS — Z1211 Encounter for screening for malignant neoplasm of colon: Secondary | ICD-10-CM

## 2024-03-19 DIAGNOSIS — Z800 Family history of malignant neoplasm of digestive organs: Secondary | ICD-10-CM | POA: Diagnosis not present

## 2024-03-19 DIAGNOSIS — Z83719 Family history of colon polyps, unspecified: Secondary | ICD-10-CM

## 2024-03-19 MED ORDER — NA SULFATE-K SULFATE-MG SULF 17.5-3.13-1.6 GM/177ML PO SOLN: 1.0000 | Freq: Once | ORAL | 0 refills | Status: AC

## 2024-03-19 NOTE — Patient Instructions (Addendum)
  GERD  Samples gourmet reflux Recommend GERD diet, no late meals  Regulate bowel habits Recommend high fiber diet  Drink plenty of fluids Exercise as tolerated  Can try over the counter Salonpas or lidocaine patches - helps with sore muscle   You have been scheduled for a colonoscopy. Please follow written instructions given to you at your visit today.   If you use inhalers (even only as needed), please bring them with you on the day of your procedure.  DO NOT TAKE 7 DAYS PRIOR TO TEST- Trulicity (dulaglutide) Ozempic, Wegovy (semaglutide) Mounjaro, Zepbound (tirzepatide) Bydureon Bcise (exanatide extended release)  DO NOT TAKE 1 DAY PRIOR TO YOUR TEST Rybelsus (semaglutide) Adlyxin (lixisenatide) Victoza (liraglutide) Byetta (exanatide) ___________________________________________________________________________  Due to recent changes in healthcare laws, you may see the results of your imaging and laboratory studies on MyChart before your provider has had a chance to review them.  We understand that in some cases there may be results that are confusing or concerning to you. Not all laboratory results come back in the same time frame and the provider may be waiting for multiple results in order to interpret others.  Please give us  48 hours in order for your provider to thoroughly review all the results before contacting the office for clarification of your results.   _______________________________________________________  If your blood pressure at your visit was 140/90 or greater, please contact your primary care physician to follow up on this.  _______________________________________________________  If you are age 3 or older, your body mass index should be between 23-30. Your Body mass index is 22.67 kg/m. If this is out of the aforementioned range listed, please consider follow up with your Primary Care Provider.  If you are age 14 or younger, your body mass index should  be between 19-25. Your Body mass index is 22.67 kg/m. If this is out of the aformentioned range listed, please consider follow up with your Primary Care Provider.   ________________________________________________________  The [facility] providers would like to encourage you to use MYCHART to communicate with providers for non-urgent requests or questions.  Due to long hold times on the telephone, sending your provider a message by [facility] may be a faster and more efficient way to get a response.  Please allow 48 business hours for a response.  Please remember that this is for non-urgent requests.  _______________________________________________________  [facility] is using a team-based approach to care.  Your team is made up of your doctor and two to three APPS. Our APPS (Nurse Practitioners and Physician Assistants) work with your physician to ensure care continuity for you. They are fully qualified to address your health concerns and develop a treatment plan. They communicate directly with your gastroenterologist to care for you. Seeing the Advanced Practice Practitioners on your physician's team can help you by facilitating care more promptly, often allowing for earlier appointments, access to diagnostic testing, procedures, and other specialty referrals.   Thank you for trusting me with your gastrointestinal care. Deanna May, FNP-C

## 2024-03-19 NOTE — Progress Notes (Signed)
 Chief Complaint:Family history of colon cancer requiring screening colonoscopy  Primary GI Doctor: Dr. Charlanne  HPI:  Patient is a  38  year old male patient with no significant past medical history who was referred to me by Philip Saddie FALCON, PA-C on 10/06/23 for a evaluation of Family history of colon cancer requiring screening colonoscopy  .    Interval History Patient presents for evaluation for colon screening colonoscopy.  Patient reports he has had intermittent rectal bleeding with wiping. Admits he Philip Todd spend some time sitting in bathroom. Patient has regular BM daily. No abdominal pain. No nausea or vomiting. Weight stable. Appetite good. Occasional heartburn , maybe 1-2 times a month. He has used omeprazole otc in the past. No dysphagia.   Former smoker. Socially drinks.  Not on blood thinners.  He takes OTC ibuprofen occasionally. Works as personnel officer ans strenuous job.  Surgical history:hernia repair   Patient's family history includes: colon cancer in his MGF (diagnosed before age 50)  and precancerous polyps in his mother (age 72),  M Aunt x 2 and M Uncle   Wt Readings from Last 3 Encounters:  03/19/24 167 lb 2 oz (75.8 kg)  06/03/23 174 lb (78.9 kg)  04/07/23 175 lb (79.4 kg)    Past Medical History:  Diagnosis Date   Allergies    Anxiety    Depression    Past Surgical History:  Procedure Laterality Date   FEMUR FRACTURE SURGERY     KNEE SURGERY Left    Fracture   RESECTION DISTAL CLAVICAL Left    fracture   UMBILICAL HERNIA REPAIR     with left inguinal hernia  repair    Current Outpatient Medications  Medication Sig Dispense Refill   azelastine  (ASTELIN ) 0.1 % nasal spray Place 1-2 sprays into both nostrils 2 (two) times daily as needed (nasal drainage). Use in each nostril as directed 30 mL 5   Azelastine -Fluticasone  137-50 MCG/ACT SUSP Place 1 spray into the nose in the morning and at bedtime. (Patient taking differently: Place 1 spray into the nose as  needed.) 23 g 5   fluticasone  (FLONASE ) 50 MCG/ACT nasal spray Place 1 spray into both nostrils 2 (two) times daily as needed (nasal congestion). 16 g 5   levocetirizine (XYZAL ) 5 MG tablet Take 1 tablet (5 mg total) by mouth every evening. (Patient taking differently: Take 5 mg by mouth as needed.) 30 tablet 5   montelukast  (SINGULAIR ) 10 MG tablet Take 1 tablet (10 mg total) by mouth at bedtime. (Patient taking differently: Take 10 mg by mouth as needed.) 30 tablet 5   Olopatadine  HCl 0.2 % SOLN Apply 1 drop to eye daily as needed (itchy/watery eyes). 2.5 mL 5   No current facility-administered medications for this visit.    Allergies as of 03/19/2024 - Review Complete 03/19/2024  Allergen Reaction Noted   Penicillins  10/23/2016    Family History  Problem Relation Age of Onset   Diabetes Mother    Colon polyps Mother    Thyroid disease Mother    Diabetes Father    Colon cancer Maternal Grandfather    Brain cancer Paternal Grandmother    Pancreatic cancer Paternal Grandmother    Lung cancer Paternal Grandmother    Colon polyps Maternal Aunt    Colon polyps Maternal Aunt    Colon polyps Maternal Uncle    Allergic rhinitis Neg Hx    Angioedema Neg Hx    Asthma Neg Hx    Eczema Neg  Hx    Urticaria Neg Hx     Review of Systems:    Constitutional: No weight loss, fever, chills, weakness or fatigue HEENT: Eyes: No change in vision               Ears, Nose, Throat:  No change in hearing or congestion Skin: No rash or itching Cardiovascular: No chest pain, chest pressure or palpitations   Respiratory: No SOB or cough Gastrointestinal: See HPI and otherwise negative Genitourinary: No dysuria or change in urinary frequency Neurological: No headache, dizziness or syncope Musculoskeletal: No new muscle or joint pain Hematologic: No bleeding or bruising Psychiatric: No history of depression or anxiety    Physical Exam:  Vital signs: BP 118/88 (BP Location: Left Arm, Patient  Position: Sitting, Cuff Size: Normal)   Pulse 76   Ht 6' (1.829 m) Comment: height measured without shoes  Wt 167 lb 2 oz (75.8 kg)   BMI 22.67 kg/m   Constitutional:   Pleasant male appears to be in NAD, Well developed, Well nourished, alert and cooperative Throat: Oral cavity and pharynx without inflammation, swelling or lesion.  Respiratory: Respirations even and unlabored. Lungs clear to auscultation bilaterally.   No wheezes, crackles, or rhonchi.  Cardiovascular: Normal S1, S2. Regular rate and rhythm. No peripheral edema, cyanosis or pallor.  Gastrointestinal:  Soft, nondistended, nontender. No rebound or guarding. Normal bowel sounds. No appreciable masses or hepatomegaly. Rectal:  Not performed. Declined Msk:  Symmetrical without gross deformities. Without edema, no deformity or joint abnormality.  Neurologic:  Alert and  oriented x4;  grossly normal neurologically.  Skin:   Dry and intact without significant lesions or rashes.  RELEVANT LABS AND IMAGING: CBC    Latest Ref Rng & Units 10/01/2023    9:19 AM 04/07/2023   10:30 AM 08/07/2022    9:57 AM  CBC  WBC 3.4 - 10.8 x10E3/uL 6.1  5.7  5.9   Hemoglobin 13.0 - 17.7 g/dL 84.3  85.7  86.0   Hematocrit 37.5 - 51.0 % 48.4  43.0  41.1   Platelets 150 - 450 x10E3/uL 296  299  295      CMP     Latest Ref Rng & Units 10/01/2023    9:19 AM 04/07/2023   10:30 AM 08/07/2022    9:57 AM  CMP  Glucose 70 - 99 mg/dL 898  98  898   BUN 6 - 20 mg/dL 18  11  21    Creatinine 0.76 - 1.27 mg/dL 9.08  9.29  9.07   Sodium 134 - 144 mmol/L 141  143  140   Potassium 3.5 - 5.2 mmol/L 4.6  4.6  4.6   Chloride 96 - 106 mmol/L 102  104  104   CO2 20 - 29 mmol/L 22  25  22    Calcium 8.7 - 10.2 mg/dL 9.6  9.5  9.3   Total Protein 6.0 - 8.5 g/dL 7.1  6.7  6.7   Total Bilirubin 0.0 - 1.2 mg/dL <9.7  0.5  0.4   Alkaline Phos 44 - 121 IU/L 69  61  67   AST 0 - 40 IU/L 17  18  30    ALT 0 - 44 IU/L 18  18  27       Lab Results  Component Value  Date   TSH 2.810 10/01/2023     Assessment: Encounter Diagnoses  Name Primary?   Family history of colon cancer Yes   Encounter for colonoscopy in patient  with family history of colon polyps    Rectal bleeding     38 year old male patient who presents to discuss colon screening colonoscopy. Patient presents with intermittent rect bleeding as well as family history with colon CA and colonic polyps. Patient's family history includes: colon cancer in his MGF (diagnosed before age 36)  and precancerous polyps in his mother (age 14),  M Aunt x 2 and M Uncle. According to ACG guidelines, we suggest initiating CRC screening with a colonoscopy at age 67 or 10 years before the youngest affected relative, whichever is earlier, for individuals with CRC or advanced polyp in 1 first-degree relative (FDR) at age <60 years or CRC or advanced polyp in >=2 FDR at any age. We suggest interval colonoscopy every 5 years.  Plan: -Recommend High fiber diet -No straining, sitting prolonged periods of times -Reinforced GERD diet -Samples gourmet reflux prn -Schedule for a colonoscopy in [facility] with Dr.Gupta . The risks and benefits of colonoscopy with possible polypectomy / biopsies were discussed and the patient agrees to proceed.     Thank you for the courtesy of this consult. Please call me with any questions or concerns.   Lempi Edwin, FNP-C [facility] 03/19/2024, 11:05 AM  Cc: Philip Saddie FALCON, PA-C

## 2024-04-12 ENCOUNTER — Ambulatory Visit: Admitting: Gastroenterology

## 2024-04-12 ENCOUNTER — Encounter: Payer: Self-pay | Admitting: Gastroenterology

## 2024-04-12 VITALS — BP 156/95 | HR 78 | Temp 97.9°F | Resp 16 | Ht 72.0 in | Wt 167.0 lb

## 2024-04-12 DIAGNOSIS — K625 Hemorrhage of anus and rectum: Secondary | ICD-10-CM

## 2024-04-12 DIAGNOSIS — K639 Disease of intestine, unspecified: Secondary | ICD-10-CM | POA: Diagnosis not present

## 2024-04-12 DIAGNOSIS — D122 Benign neoplasm of ascending colon: Secondary | ICD-10-CM

## 2024-04-12 DIAGNOSIS — Z800 Family history of malignant neoplasm of digestive organs: Secondary | ICD-10-CM

## 2024-04-12 DIAGNOSIS — Z1211 Encounter for screening for malignant neoplasm of colon: Secondary | ICD-10-CM

## 2024-04-12 MED ORDER — SODIUM CHLORIDE 0.9 % IV SOLN: 500.0000 mL | Freq: Once | INTRAVENOUS | Status: DC

## 2024-04-12 NOTE — Progress Notes (Unsigned)
 Called to room to assist during endoscopic procedure.  Patient ID and intended procedure confirmed with present staff. Received instructions for my participation in the procedure from the performing physician.

## 2024-04-12 NOTE — Patient Instructions (Signed)
 Resume previous diet  Use Metamucil or Benefiber one teaspoon daily with 8 ounces of water.  Await pathology results  For hemorrhoids, use Preparation H twice daily after the bowel movement for 7 to 10 days as needed  See handouts for diverticulosis, hemorrhoids and polyps  YOU HAD AN ENDOSCOPIC PROCEDURE TODAY AT THE [facility]:   Refer to the procedure report that was given to you for any specific questions about what was found during the examination.  If the procedure report does not answer your questions, please call your gastroenterologist to clarify.  If you requested that your care partner not be given the details of your procedure findings, then the procedure report has been included in a sealed envelope for you to review at your convenience later.  YOU SHOULD EXPECT: Some feelings of bloating in the abdomen. Passage of more gas than usual.  Walking can help get rid of the air that was put into your GI tract during the procedure and reduce the bloating. If you had a lower endoscopy (such as a colonoscopy or flexible sigmoidoscopy) you may notice spotting of blood in your stool or on the toilet paper. If you underwent a bowel prep for your procedure, you may not have a normal bowel movement for a few days.  Please Note:  You might notice some irritation and congestion in your nose or some drainage.  This is from the oxygen used during your procedure.  There is no need for concern and it should clear up in a day or so.  SYMPTOMS TO REPORT IMMEDIATELY: Following lower endoscopy (colonoscopy or flexible sigmoidoscopy):  Excessive amounts of blood in the stool  Significant tenderness or worsening of abdominal pains  Swelling of the abdomen that is new, acute  Fever of 100F or higher  For urgent or emergent issues, a gastroenterologist can be reached at any hour by calling [phone]. Do not use MyChart messaging for urgent concerns.   DIET:  We do recommend a small meal at  first, but then you may proceed to your regular diet.  Drink plenty of fluids but you should avoid alcoholic beverages for 24 hours.  ACTIVITY:  You should plan to take it easy for the rest of today and you should NOT DRIVE or use heavy machinery until tomorrow (because of the sedation medicines used during the test).    FOLLOW UP: Our staff will call the number listed on your records the next business day following your procedure.  We will call around 7:15- 8:00 am to check on you and address any questions or concerns that you may have regarding the information given to you following your procedure. If we do not reach you, we will leave a message.     If any biopsies were taken you will be contacted by phone or by letter within the next 1-3 weeks.  Please call us  at [phone] if you have not heard about the biopsies in 3 weeks.   SIGNATURES/CONFIDENTIALITY: You and/or your care partner have signed paperwork which will be entered into your electronic medical record.  These signatures attest to the fact that that the information above on your After Visit Summary has been reviewed and is understood.  Full responsibility of the confidentiality of this discharge information lies with you and/or your care-partner.

## 2024-04-12 NOTE — Progress Notes (Unsigned)
 Expand All Collapse All     Chief Complaint:Family history of colon cancer requiring screening colonoscopy  Primary GI Doctor: Dr. Charlanne   HPI:  Patient is a  38  year old male patient with no significant past medical history who was referred to me by [name], PA-C on 10/06/23 for a evaluation of Family history of colon cancer requiring screening colonoscopy  .     Interval History Patient presents for evaluation for colon screening colonoscopy.  Patient reports he has had intermittent rectal bleeding with wiping. Admits he may spend some time sitting in bathroom. Patient has regular BM daily. No abdominal pain. No nausea or vomiting. Weight stable. Appetite good. Occasional heartburn , maybe 1-2 times a month. He has used omeprazole otc in the past. No dysphagia.    Former smoker. Socially drinks.   Not on blood thinners.   He takes OTC ibuprofen occasionally. Works as personnel officer ans strenuous job.   Surgical history:hernia repair    Patient's family history includes: colon cancer in his MGF (diagnosed before age 42)  and precancerous polyps in his mother (age 40),  M Aunt x 2 and M Uncle       Wt Readings from Last 3 Encounters:  03/19/24 167 lb 2 oz (75.8 kg)  06/03/23 174 lb (78.9 kg)  04/07/23 175 lb (79.4 kg)        Past Medical History:  Diagnosis Date   Allergies     Anxiety     Depression               Past Surgical History:  Procedure Laterality Date   FEMUR FRACTURE SURGERY       KNEE SURGERY Left      Fracture   RESECTION DISTAL CLAVICAL Left      fracture   UMBILICAL HERNIA REPAIR        with left inguinal hernia  repair                Current Outpatient Medications  Medication Sig Dispense Refill   azelastine  (ASTELIN ) 0.1 % nasal spray Place 1-2 sprays into both nostrils 2 (two) times daily as needed (nasal drainage). Use in each nostril as directed 30 mL 5   Azelastine -Fluticasone  137-50 MCG/ACT SUSP Place 1 spray into the nose in the  morning and at bedtime. (Patient taking differently: Place 1 spray into the nose as needed.) 23 g 5   fluticasone  (FLONASE ) 50 MCG/ACT nasal spray Place 1 spray into both nostrils 2 (two) times daily as needed (nasal congestion). 16 g 5   levocetirizine (XYZAL ) 5 MG tablet Take 1 tablet (5 mg total) by mouth every evening. (Patient taking differently: Take 5 mg by mouth as needed.) 30 tablet 5   montelukast  (SINGULAIR ) 10 MG tablet Take 1 tablet (10 mg total) by mouth at bedtime. (Patient taking differently: Take 10 mg by mouth as needed.) 30 tablet 5   Olopatadine  HCl 0.2 % SOLN Apply 1 drop to eye daily as needed (itchy/watery eyes). 2.5 mL 5      No current facility-administered medications for this visit.             Allergies as of 03/19/2024 - Review Complete 03/19/2024  Allergen Reaction Noted   Penicillins   10/23/2016           Family History  Problem Relation Age of Onset   Diabetes Mother     Colon polyps Mother     Thyroid disease Mother  Diabetes Father     Colon cancer Maternal Grandfather     Brain cancer Paternal Grandmother     Pancreatic cancer Paternal Grandmother     Lung cancer Paternal Grandmother     Colon polyps Maternal Aunt     Colon polyps Maternal Aunt     Colon polyps Maternal Uncle     Allergic rhinitis Neg Hx     Angioedema Neg Hx     Asthma Neg Hx     Eczema Neg Hx     Urticaria Neg Hx            Review of Systems:    Constitutional: No weight loss, fever, chills, weakness or fatigue HEENT: Eyes: No change in vision               Ears, Nose, Throat:  No change in hearing or congestion Skin: No rash or itching Cardiovascular: No chest pain, chest pressure or palpitations   Respiratory: No SOB or cough Gastrointestinal: See HPI and otherwise negative Genitourinary: No dysuria or change in urinary frequency Neurological: No headache, dizziness or syncope Musculoskeletal: No new muscle or joint pain Hematologic: No bleeding or  bruising Psychiatric: No history of depression or anxiety      Physical Exam:  Vital signs: BP 118/88 (BP Location: Left Arm, Patient Position: Sitting, Cuff Size: Normal)   Pulse 76   Ht 6' (1.829 m) Comment: height measured without shoes  Wt 167 lb 2 oz (75.8 kg)   BMI 22.67 kg/m    Constitutional:   Pleasant male appears to be in NAD, Well developed, Well nourished, alert and cooperative Throat: Oral cavity and pharynx without inflammation, swelling or lesion.  Respiratory: Respirations even and unlabored. Lungs clear to auscultation bilaterally.   No wheezes, crackles, or rhonchi.  Cardiovascular: Normal S1, S2. Regular rate and rhythm. No peripheral edema, cyanosis or pallor.  Gastrointestinal:  Soft, nondistended, nontender. No rebound or guarding. Normal bowel sounds. No appreciable masses or hepatomegaly. Rectal:  Not performed. Declined Msk:  Symmetrical without gross deformities. Without edema, no deformity or joint abnormality.  Neurologic:  Alert and  oriented x4;  grossly normal neurologically.  Skin:   Dry and intact without significant lesions or rashes.   RELEVANT LABS AND IMAGING: CBC     Latest Ref Rng & Units 10/01/2023    9:19 AM 04/07/2023   10:30 AM 08/07/2022    9:57 AM  CBC  WBC 3.4 - 10.8 x10E3/uL 6.1  5.7  5.9   Hemoglobin 13.0 - 17.7 g/dL 84.3  85.7  86.0   Hematocrit 37.5 - 51.0 % 48.4  43.0  41.1   Platelets 150 - 450 x10E3/uL 296  299  295       CMP         Latest Ref Rng & Units 10/01/2023    9:19 AM 04/07/2023   10:30 AM 08/07/2022    9:57 AM  CMP  Glucose 70 - 99 mg/dL 898  98  898   BUN 6 - 20 mg/dL 18  11  21    Creatinine 0.76 - 1.27 mg/dL 9.08  9.29  9.07   Sodium 134 - 144 mmol/L 141  143  140   Potassium 3.5 - 5.2 mmol/L 4.6  4.6  4.6   Chloride 96 - 106 mmol/L 102  104  104   CO2 20 - 29 mmol/L 22  25  22    Calcium 8.7 - 10.2 mg/dL 9.6  9.5  9.3  Total Protein 6.0 - 8.5 g/dL 7.1  6.7  6.7   Total Bilirubin 0.0 - 1.2 mg/dL <9.7  0.5   0.4   Alkaline Phos 44 - 121 IU/L 69  61  67   AST 0 - 40 IU/L 17  18  30    ALT 0 - 44 IU/L 18  18  27        Recent Labs       Lab Results  Component Value Date    TSH 2.810 10/01/2023        Assessment:     Encounter Diagnoses  Name Primary?   Family history of colon cancer Yes   Encounter for colonoscopy in patient with family history of colon polyps     Rectal bleeding      38 year old male patient who presents to discuss colon screening colonoscopy. Patient presents with intermittent rect bleeding as well as family history with colon CA and colonic polyps. Patient's family history includes: colon cancer in his MGF (diagnosed before age 92)  and precancerous polyps in his mother (age 77),  M Aunt x 2 and M Uncle. According to ACG guidelines, we suggest initiating CRC screening with a colonoscopy at age 66 or 10 years before the youngest affected relative, whichever is earlier, for individuals with CRC or advanced polyp in 1 first-degree relative (FDR) at age <60 years or CRC or advanced polyp in >=2 FDR at any age. We suggest interval colonoscopy every 5 years.   Plan: -Recommend High fiber diet -No straining, sitting prolonged periods of times -Reinforced GERD diet -Samples gourmet reflux prn -Schedule for a colonoscopy in [facility] with Dr.Sweet Jarvis . The risks and benefits of colonoscopy with possible polypectomy / biopsies were discussed and the patient agrees to proceed.      Thank you for the courtesy of this consult. Please call me with any questions or concerns.    Deanna May, FNP-C       Attending physician's note   I have taken history, reviewed the chart and examined the patient. I performed a substantive portion of this encounter, including complete performance of at least one of the key components, in conjunction with the APP. I agree with the Advanced Practitioner's note, impression and recommendations.   For colon today   Anselm Bring, MD [facility] [phone]

## 2024-04-12 NOTE — Progress Notes (Unsigned)
 Sedate, gd SR, tolerated procedure well, VSS, report to RN

## 2024-04-12 NOTE — Op Note (Signed)
  [facility] Patient Name: Philip Todd Procedure Date: 04/12/2024 3:45 PM MRN: [id number] Endoscopist: Lynnie Bring , MD, [id number] Age: 38 Referring MD:  Date of Birth: 10/07/1985 Gender: Male Account #: [id number] Procedure:                Colonoscopy Indications:              Colon cancer screening in patient at increased                            risk: colon cancer in his MGF (diagnosed before age                            61) and precancerous polyps in his mother (age 19),                            M Aunt x 2 and M Uncle Medicines:                Monitored Anesthesia Care Procedure:                Pre-Anesthesia Assessment:                           - Prior to the procedure, a History and Physical                            was performed, and patient medications and                            allergies were reviewed. The patient's tolerance of                            previous anesthesia was also reviewed. The risks                            and benefits of the procedure and the sedation                            options and risks were discussed with the patient.                            All questions were answered, and informed consent                            was obtained. Prior Anticoagulants: The patient has                            taken no anticoagulant or antiplatelet agents. ASA                            Grade Assessment: I - A normal, healthy patient.                            After reviewing the risks and benefits, the patient  was deemed in satisfactory condition to undergo the                            procedure.                           After obtaining informed consent, the colonoscope                            was passed under direct vision. Throughout the                            procedure, the patient's blood pressure, pulse, and                            oxygen saturations were monitored continuously. The                             CF [id number] #[id number] was introduced through the anus                            and advanced to the 2 cm into the ileum. The                            colonoscopy was performed without difficulty. The                            patient tolerated the procedure well. The quality                            of the bowel preparation was good. The terminal                            ileum, ileocecal valve, appendiceal orifice, and                            rectum were photographed. Scope In: 3:58:57 PM Scope Out: 4:13:33 PM Scope Withdrawal Time: 0 hours 9 minutes 55 seconds  Total Procedure Duration: 0 hours 14 minutes 36 seconds  Findings:                 A 6 mm polyp was found in the proximal ascending                            colon. The polyp was sessile. The polyp was removed                            with a cold snare. Resection and retrieval were                            complete.                           Many medium-mouthed diverticula were found in the  sigmoid colon and descending colon.                           Non-bleeding internal hemorrhoids (gd 1) were found                            during retroflexion and during perianal exam. 1                            channel of moderate and Grade II internal                            hemorrhoids were noted.                           The terminal ileum appeared normal.                           Retroflexion in the right colon was performed.                           The exam was otherwise without abnormality on                            direct and retroflexion views. Complications:            No immediate complications. Estimated Blood Loss:     Estimated blood loss: none. Impression:               - One 6 mm polyp in the proximal ascending colon,                            removed with a cold snare. Resected and retrieved.                           - Diverticulosis in the  sigmoid colon and in the                            descending colon.                           - Non-bleeding internal hemorrhoids.                           - The examined portion of the ileum was normal.                           - The examination was otherwise normal on direct                            and retroflexion views. Recommendation:           - Patient has a contact number available for                            emergencies. The signs and symptoms of potential  delayed complications were discussed with the                            patient. Return to normal activities tomorrow.                            Written discharge instructions were provided to the                            patient.                           - Resume previous diet.                           - Use Metamucil or Benefiber one teaspoon PO daily                            with 8 ounces of water.                           - Await pathology results.                           - For hemorrhoids, use Preparation H 1 twice daily                            after the bowel movement for 7 to 10 days as needed.                           - The findings and recommendations were discussed                            with the patient's family. Lynnie Bring, MD 04/12/2024 4:20:22 PM This report has been signed electronically.

## 2024-04-12 NOTE — Progress Notes (Unsigned)
 Pt's states no medical or surgical changes since previsit or office visit.

## 2024-04-13 ENCOUNTER — Telehealth: Payer: Self-pay

## 2024-04-13 NOTE — Telephone Encounter (Signed)
 Attempted to reach patient for post-procedure f/u call. No answer. Left message for him to please not hesitate to call if he has questions/concerns regarding his care.

## 2024-04-15 LAB — SURGICAL PATHOLOGY

## 2024-04-17 ENCOUNTER — Ambulatory Visit: Payer: Self-pay | Admitting: Gastroenterology

## 2024-04-22 ENCOUNTER — Encounter: Admitting: Gastroenterology

## 2024-09-29 ENCOUNTER — Other Ambulatory Visit

## 2024-10-06 ENCOUNTER — Encounter
